# Patient Record
Sex: Female | Born: 1935
Health system: Southern US, Community
[De-identification: ages and names within clinical notes are randomized; demographics above are authoritative.]

## PROBLEM LIST (undated history)

## (undated) DIAGNOSIS — Z85828 Personal history of other malignant neoplasm of skin: Secondary | ICD-10-CM

## (undated) DIAGNOSIS — H3530 Unspecified macular degeneration: Secondary | ICD-10-CM

## (undated) DIAGNOSIS — M1990 Unspecified osteoarthritis, unspecified site: Secondary | ICD-10-CM

## (undated) DIAGNOSIS — N390 Urinary tract infection, site not specified: Secondary | ICD-10-CM

## (undated) DIAGNOSIS — F329 Major depressive disorder, single episode, unspecified: Secondary | ICD-10-CM

## (undated) DIAGNOSIS — F32A Depression, unspecified: Secondary | ICD-10-CM

## (undated) DIAGNOSIS — F419 Anxiety disorder, unspecified: Secondary | ICD-10-CM

## (undated) DIAGNOSIS — B399 Histoplasmosis, unspecified: Secondary | ICD-10-CM

## (undated) HISTORY — PX: EYE SURGERY: SHX253

## (undated) HISTORY — PX: COSMETIC SURGERY: SHX468

## (undated) HISTORY — PX: BREAST LUMPECTOMY: SHX2

## (undated) HISTORY — PX: ANAL FISSURE REPAIR: SHX2312

## (undated) HISTORY — PX: OTHER SURGICAL HISTORY: SHX169

---

## 1997-07-17 ENCOUNTER — Ambulatory Visit (HOSPITAL_COMMUNITY): Admission: RE | Admit: 1997-07-17 | Discharge: 1997-07-17 | Payer: Self-pay | Admitting: Family Medicine

## 1997-10-22 ENCOUNTER — Other Ambulatory Visit: Admission: RE | Admit: 1997-10-22 | Discharge: 1997-10-22 | Payer: Self-pay | Admitting: Internal Medicine

## 1998-12-02 ENCOUNTER — Other Ambulatory Visit: Admission: RE | Admit: 1998-12-02 | Discharge: 1998-12-02 | Payer: Self-pay | Admitting: Family Medicine

## 1998-12-23 ENCOUNTER — Encounter: Payer: Self-pay | Admitting: Family Medicine

## 1998-12-23 ENCOUNTER — Ambulatory Visit (HOSPITAL_COMMUNITY): Admission: RE | Admit: 1998-12-23 | Discharge: 1998-12-23 | Payer: Self-pay | Admitting: Family Medicine

## 2000-03-10 ENCOUNTER — Other Ambulatory Visit: Admission: RE | Admit: 2000-03-10 | Discharge: 2000-03-10 | Payer: Self-pay | Admitting: Family Medicine

## 2000-03-14 ENCOUNTER — Ambulatory Visit (HOSPITAL_COMMUNITY): Admission: RE | Admit: 2000-03-14 | Discharge: 2000-03-14 | Payer: Self-pay | Admitting: Family Medicine

## 2000-03-14 ENCOUNTER — Encounter: Payer: Self-pay | Admitting: Family Medicine

## 2001-03-27 ENCOUNTER — Encounter: Payer: Self-pay | Admitting: Family Medicine

## 2001-03-27 ENCOUNTER — Ambulatory Visit (HOSPITAL_COMMUNITY): Admission: RE | Admit: 2001-03-27 | Discharge: 2001-03-27 | Payer: Self-pay | Admitting: Family Medicine

## 2002-05-02 ENCOUNTER — Other Ambulatory Visit: Admission: RE | Admit: 2002-05-02 | Discharge: 2002-05-02 | Payer: Self-pay | Admitting: Family Medicine

## 2002-05-07 ENCOUNTER — Encounter: Payer: Self-pay | Admitting: Family Medicine

## 2002-05-07 ENCOUNTER — Ambulatory Visit (HOSPITAL_COMMUNITY): Admission: RE | Admit: 2002-05-07 | Discharge: 2002-05-07 | Payer: Self-pay | Admitting: Family Medicine

## 2003-05-09 ENCOUNTER — Ambulatory Visit (HOSPITAL_COMMUNITY): Admission: RE | Admit: 2003-05-09 | Discharge: 2003-05-09 | Payer: Self-pay | Admitting: Family Medicine

## 2003-06-27 ENCOUNTER — Encounter: Admission: RE | Admit: 2003-06-27 | Discharge: 2003-06-27 | Payer: Self-pay | Admitting: Family Medicine

## 2004-06-29 ENCOUNTER — Ambulatory Visit (HOSPITAL_COMMUNITY): Admission: RE | Admit: 2004-06-29 | Discharge: 2004-06-29 | Payer: Self-pay | Admitting: Family Medicine

## 2005-07-01 ENCOUNTER — Ambulatory Visit (HOSPITAL_COMMUNITY): Admission: RE | Admit: 2005-07-01 | Discharge: 2005-07-01 | Payer: Self-pay | Admitting: Family Medicine

## 2006-07-19 ENCOUNTER — Ambulatory Visit (HOSPITAL_COMMUNITY): Admission: RE | Admit: 2006-07-19 | Discharge: 2006-07-19 | Payer: Self-pay | Admitting: Family Medicine

## 2007-08-14 ENCOUNTER — Ambulatory Visit (HOSPITAL_COMMUNITY): Admission: RE | Admit: 2007-08-14 | Discharge: 2007-08-14 | Payer: Self-pay | Admitting: Family Medicine

## 2008-08-14 ENCOUNTER — Ambulatory Visit (HOSPITAL_COMMUNITY): Admission: RE | Admit: 2008-08-14 | Discharge: 2008-08-14 | Payer: Self-pay | Admitting: Family Medicine

## 2009-06-23 ENCOUNTER — Encounter: Admission: RE | Admit: 2009-06-23 | Discharge: 2009-07-29 | Payer: Self-pay | Admitting: Family Medicine

## 2009-09-04 ENCOUNTER — Ambulatory Visit (HOSPITAL_COMMUNITY): Admission: RE | Admit: 2009-09-04 | Discharge: 2009-09-04 | Payer: Self-pay | Admitting: Family Medicine

## 2010-06-28 ENCOUNTER — Other Ambulatory Visit (HOSPITAL_COMMUNITY): Payer: Self-pay | Admitting: Family Medicine

## 2010-06-28 DIAGNOSIS — Z1231 Encounter for screening mammogram for malignant neoplasm of breast: Secondary | ICD-10-CM

## 2010-09-07 ENCOUNTER — Ambulatory Visit (HOSPITAL_COMMUNITY)
Admission: RE | Admit: 2010-09-07 | Discharge: 2010-09-07 | Disposition: A | Discharge status: Home / Self Care | Payer: Medicare Other | Source: Ambulatory Visit | Attending: Family Medicine | Admitting: Family Medicine

## 2010-09-07 DIAGNOSIS — Z1231 Encounter for screening mammogram for malignant neoplasm of breast: Secondary | ICD-10-CM

## 2014-01-28 ENCOUNTER — Inpatient Hospital Stay (HOSPITAL_COMMUNITY)
Admission: EM | Admit: 2014-01-28 | Discharge: 2014-01-29 | DRG: 689 | Disposition: A | Discharge status: Home / Self Care | Payer: Medicare HMO | Attending: Family Medicine | Admitting: Family Medicine

## 2014-01-28 ENCOUNTER — Emergency Department (HOSPITAL_COMMUNITY): Payer: Medicare HMO

## 2014-01-28 ENCOUNTER — Encounter (HOSPITAL_COMMUNITY): Payer: Self-pay | Admitting: Physical Medicine and Rehabilitation

## 2014-01-28 DIAGNOSIS — H3530 Unspecified macular degeneration: Secondary | ICD-10-CM | POA: Diagnosis present

## 2014-01-28 DIAGNOSIS — R410 Disorientation, unspecified: Secondary | ICD-10-CM | POA: Diagnosis present

## 2014-01-28 DIAGNOSIS — K7689 Other specified diseases of liver: Secondary | ICD-10-CM

## 2014-01-28 DIAGNOSIS — G92 Toxic encephalopathy: Secondary | ICD-10-CM | POA: Diagnosis present

## 2014-01-28 DIAGNOSIS — Z880 Allergy status to penicillin: Secondary | ICD-10-CM | POA: Diagnosis not present

## 2014-01-28 DIAGNOSIS — R739 Hyperglycemia, unspecified: Secondary | ICD-10-CM | POA: Diagnosis present

## 2014-01-28 DIAGNOSIS — G459 Transient cerebral ischemic attack, unspecified: Secondary | ICD-10-CM | POA: Diagnosis present

## 2014-01-28 DIAGNOSIS — Z882 Allergy status to sulfonamides status: Secondary | ICD-10-CM

## 2014-01-28 DIAGNOSIS — R945 Abnormal results of liver function studies: Secondary | ICD-10-CM | POA: Diagnosis present

## 2014-01-28 DIAGNOSIS — E871 Hypo-osmolality and hyponatremia: Secondary | ICD-10-CM | POA: Diagnosis present

## 2014-01-28 DIAGNOSIS — N390 Urinary tract infection, site not specified: Principal | ICD-10-CM | POA: Diagnosis present

## 2014-01-28 DIAGNOSIS — R4182 Altered mental status, unspecified: Secondary | ICD-10-CM

## 2014-01-28 HISTORY — DX: Personal history of other malignant neoplasm of skin: Z85.828

## 2014-01-28 HISTORY — DX: Anxiety disorder, unspecified: F41.9

## 2014-01-28 HISTORY — DX: Unspecified macular degeneration: H35.30

## 2014-01-28 HISTORY — DX: Urinary tract infection, site not specified: N39.0

## 2014-01-28 HISTORY — DX: Unspecified osteoarthritis, unspecified site: M19.90

## 2014-01-28 HISTORY — DX: Histoplasmosis, unspecified: B39.9

## 2014-01-28 HISTORY — DX: Depression, unspecified: F32.A

## 2014-01-28 HISTORY — DX: Major depressive disorder, single episode, unspecified: F32.9

## 2014-01-28 LAB — CBC WITH DIFFERENTIAL/PLATELET
Basophils Absolute: 0 10*3/uL (ref 0.0–0.1)
Basophils Relative: 0 % (ref 0–1)
Eosinophils Absolute: 0.1 10*3/uL (ref 0.0–0.7)
Eosinophils Relative: 1 % (ref 0–5)
HCT: 38.9 % (ref 36.0–46.0)
Hemoglobin: 13.2 g/dL (ref 12.0–15.0)
Lymphocytes Relative: 14 % (ref 12–46)
Lymphs Abs: 0.9 10*3/uL (ref 0.7–4.0)
MCH: 32 pg (ref 26.0–34.0)
MCHC: 33.9 g/dL (ref 30.0–36.0)
MCV: 94.2 fL (ref 78.0–100.0)
MONO ABS: 0.6 10*3/uL (ref 0.1–1.0)
Monocytes Relative: 8 % (ref 3–12)
NEUTROS PCT: 77 % (ref 43–77)
Neutro Abs: 5.3 10*3/uL (ref 1.7–7.7)
PLATELETS: 160 10*3/uL (ref 150–400)
RBC: 4.13 MIL/uL (ref 3.87–5.11)
RDW: 12.6 % (ref 11.5–15.5)
WBC: 6.8 10*3/uL (ref 4.0–10.5)

## 2014-01-28 LAB — COMPREHENSIVE METABOLIC PANEL
ALBUMIN: 4.2 g/dL (ref 3.5–5.2)
ALT: 18 U/L (ref 0–35)
AST: 43 U/L — AB (ref 0–37)
Alkaline Phosphatase: 70 U/L (ref 39–117)
Anion gap: 14 (ref 5–15)
BILIRUBIN TOTAL: 0.4 mg/dL (ref 0.3–1.2)
BUN: 12 mg/dL (ref 6–23)
CALCIUM: 9.5 mg/dL (ref 8.4–10.5)
CHLORIDE: 91 meq/L — AB (ref 96–112)
CO2: 26 mEq/L (ref 19–32)
Creatinine, Ser: 0.64 mg/dL (ref 0.50–1.10)
GFR calc Af Amer: 90 mL/min — ABNORMAL LOW (ref >90)
GFR calc non Af Amer: 77 mL/min — ABNORMAL LOW (ref >90)
Glucose, Bld: 111 mg/dL — ABNORMAL HIGH (ref 70–99)
POTASSIUM: 3.7 meq/L (ref 3.7–5.3)
SODIUM: 131 meq/L — AB (ref 137–147)
Total Protein: 7.2 g/dL (ref 6.0–8.3)

## 2014-01-28 LAB — URINALYSIS, ROUTINE W REFLEX MICROSCOPIC
BILIRUBIN URINE: NEGATIVE
GLUCOSE, UA: NEGATIVE mg/dL
Hgb urine dipstick: NEGATIVE
KETONES UR: 15 mg/dL — AB
NITRITE: NEGATIVE
PH: 7.5 (ref 5.0–8.0)
Protein, ur: NEGATIVE mg/dL
Specific Gravity, Urine: 1.017 (ref 1.005–1.030)
Urobilinogen, UA: 0.2 mg/dL (ref 0.0–1.0)

## 2014-01-28 LAB — URINE MICROSCOPIC-ADD ON

## 2014-01-28 LAB — SODIUM, URINE, RANDOM: Sodium, Ur: 81 mEq/L

## 2014-01-28 LAB — TSH: TSH: 1.18 u[IU]/mL (ref 0.350–4.500)

## 2014-01-28 LAB — AMMONIA: Ammonia: 10 umol/L — ABNORMAL LOW (ref 11–60)

## 2014-01-28 MED ORDER — DEXTROSE 5 % IV SOLN
1.0000 g | Freq: Once | INTRAVENOUS | Status: AC
  Administered 2014-01-28: 1 g via INTRAVENOUS
  Filled 2014-01-28: qty 10

## 2014-01-28 MED ORDER — AMITRIPTYLINE HCL 25 MG PO TABS
25.0000 mg | ORAL_TABLET | Freq: Every day | ORAL | Status: DC
  Administered 2014-01-28 – 2014-01-29 (×2): 25 mg via ORAL
  Filled 2014-01-28 (×2): qty 1

## 2014-01-28 MED ORDER — SODIUM CHLORIDE 0.9 % IV SOLN
Freq: Once | INTRAVENOUS | Status: AC
  Administered 2014-01-28: 18:00:00 via INTRAVENOUS

## 2014-01-28 MED ORDER — SODIUM CHLORIDE 0.9 % IV SOLN
INTRAVENOUS | Status: DC
  Administered 2014-01-28 – 2014-01-29 (×2): via INTRAVENOUS

## 2014-01-28 MED ORDER — BUPROPION HCL ER (XL) 150 MG PO TB24
150.0000 mg | ORAL_TABLET | Freq: Every day | ORAL | Status: DC
  Filled 2014-01-28: qty 1

## 2014-01-28 MED ORDER — HYDRALAZINE HCL 20 MG/ML IJ SOLN: 5.0000 mg | Freq: Four times a day (QID) | INTRAMUSCULAR | Status: DC | PRN

## 2014-01-28 MED ORDER — ENOXAPARIN SODIUM 40 MG/0.4ML ~~LOC~~ SOLN
40.0000 mg | SUBCUTANEOUS | Status: DC
  Filled 2014-01-28 (×2): qty 0.4

## 2014-01-28 MED ORDER — LEVOFLOXACIN IN D5W 500 MG/100ML IV SOLN
500.0000 mg | INTRAVENOUS | Status: DC
Start: 2014-01-28 — End: 2014-01-29
  Administered 2014-01-28: 500 mg via INTRAVENOUS
  Filled 2014-01-28 (×2): qty 100

## 2014-01-28 MED ORDER — ALPRAZOLAM 0.5 MG PO TABS
0.5000 mg | ORAL_TABLET | Freq: Three times a day (TID) | ORAL | Status: DC
  Administered 2014-01-28: 0.5 mg via ORAL
  Filled 2014-01-28 (×2): qty 1

## 2014-01-28 NOTE — ED Notes (Signed)
Pt to CT at this time.

## 2014-01-28 NOTE — ED Provider Notes (Signed)
CSN: [id number]     Arrival date & time 01/28/14  1504 History   First MD Initiated Contact with Patient 01/28/14 1618     Chief Complaint  Patient presents with  . Stroke Symptoms     (Consider location/radiation/quality/duration/timing/severity/associated sxs/prior Treatment) HPI Patient is an 80 year old female who presents as a referral from her primary care doctor for concern for CVA. On Friday night she began having symptoms of slurred speech, increased confusion, and weakness of bilateral lower extremities. History is somewhat limited by the patient's confusion. Patient's son services historian.   Patient has no history of CVA. She lives at an assisted living facility, and son reports that they initially thought her symptoms of confusion and slurred speech would get better, but they did not improve over the course of the last few days. She was found laying on the floor of her apartment this morning. She reports that she took her sleeping medication and was unable to get into bed last night. She has also had difficulty getting into and out of her wheelchair due to leg weakness.   History reviewed. No pertinent past medical history. History reviewed. No pertinent past surgical history. No family history on file. History  Substance Use Topics  . Smoking status: Never Smoker   . Smokeless tobacco: Not on file  . Alcohol Use: No   OB History    No data available     Review of Systems  Constitutional: Positive for fever.  Respiratory: Negative for cough and shortness of breath.   Cardiovascular: Negative for chest pain and leg swelling.  Neurological: Positive for speech difficulty and weakness (Generalized). Negative for dizziness, syncope, numbness and headaches.      Allergies  Penicillins and Sulfur  Home Medications   Prior to Admission medications   Medication Sig Start Date End Date Taking? Authorizing Provider  ALPRAZolam Duanne Moron) 0.5 MG tablet Take 0.5 mg by  mouth 3 (three) times daily.  01/10/14  Yes Historical Provider, MD  amitriptyline (ELAVIL) 25 MG tablet Take 25 mg by mouth daily. 11/06/13  Yes Historical Provider, MD  buPROPion (WELLBUTRIN XL) 150 MG 24 hr tablet Take 150 mg by mouth daily. 01/22/14  Yes Historical Provider, MD  traZODone (DESYREL) 50 MG tablet Take 100 mg by mouth at bedtime.  12/23/13  Yes Historical Provider, MD   BP 180/69 mmHg  Pulse 81  Temp(Src) 97.8 F (36.6 C)  Resp 19  SpO2 100% Physical Exam  Constitutional: She is oriented to person, place, and time. She appears well-developed. No distress.  HENT:  Head: Normocephalic and atraumatic.  Eyes: EOM are normal. Pupils are equal, round, and reactive to light.  Neck: Normal range of motion. Neck supple. No JVD present.  No spinous process tenderness  Cardiovascular: Normal rate, regular rhythm and normal heart sounds.   No murmur heard. Pulmonary/Chest: Effort normal and breath sounds normal.  Abdominal: Soft. Bowel sounds are normal. She exhibits no distension. There is no tenderness.  Musculoskeletal: Normal range of motion. She exhibits no edema.  Neurological: She is alert and oriented to person, place, and time.  Mildly confused, rambles Cranial nerves II through X intact Strength 4 out of 5 in bilateral upper extremities Sensation grossly intact in bilateral upper and lower extremities Strength 3 out of 5 in bilateral lower extremities, symmetric Normal reflexes Speech slightly slurred   Skin: Skin is warm and dry.  Nursing note and vitals reviewed.   ED Course  Procedures (including critical care time) Labs  Review Labs Reviewed  COMPREHENSIVE METABOLIC PANEL - Abnormal; Notable for the following:    Sodium 131 (*)    Chloride 91 (*)    Glucose, Bld 111 (*)    AST 43 (*)    GFR calc non Af Amer 77 (*)    GFR calc Af Amer 90 (*)    All other components within normal limits  URINALYSIS, ROUTINE W REFLEX MICROSCOPIC - Abnormal; Notable for the  following:    APPearance CLOUDY (*)    Ketones, ur 15 (*)    Leukocytes, UA LARGE (*)    All other components within normal limits  URINE MICROSCOPIC-ADD ON - Abnormal; Notable for the following:    Squamous Epithelial / LPF FEW (*)    Bacteria, UA FEW (*)    Casts HYALINE CASTS (*)    All other components within normal limits  CBC WITH DIFFERENTIAL    Imaging Review Ct Head Wo Contrast  01/28/2014   CLINICAL DATA:  Found on the floor this morning by staff. Family states she hasn't been herself since Friday, slurred speech and difficulty walking. Pt denies pain upon arrival, she is alert, but confused  EXAM: CT HEAD WITHOUT CONTRAST  TECHNIQUE: Contiguous axial images were obtained from the base of the skull through the vertex without intravenous contrast.  COMPARISON:  06/27/2003  FINDINGS: There is no evidence of mass effect, midline shift, or extra-axial fluid collections. There is no evidence of a space-occupying lesion or intracranial hemorrhage. There is no evidence of a cortical-based area of acute infarction. There is generalized cerebral atrophy. There is periventricular white matter low attenuation likely secondary to microangiopathy.  The ventricles and sulci are appropriate for the patient's age. The basal cisterns are patent.  The right globe is intact. The left globe is irregular in contour and smaller within the right lobe with peripheral calcification. There are small air-fluid levels in bilateral maxillary sinuses.  The osseous structures are unremarkable.  IMPRESSION: No acute intracranial pathology.   Electronically Signed   By: Kathreen Devoid   On: 01/28/2014 17:11     EKG Interpretation None      MDM   Final diagnoses:  UTI (lower urinary tract infection)   80 year old female presenting with confusion and slurred speech. Nonfocal neurologic neurologic exam, symmetric upper and lower extremity strength and coordination, mildly decreased strength in the lower extremities.  Speech is mildly slurred, but this did not have an abrupt onset. Patient is afebrile, no leukocytosis noted. She does have a UA consistent with a urinary tract infection. CT of the head was obtained as the patient was found on the floor, no significant findings found, no evidence of bleeding. She has no tenderness to palpation to her chest back or extremities, no other signs of trauma.  We will treat with Rocephin and admit for observation.  Leata Mouse, MD 01/29/14 [id number]  Mariea Clonts, MD 01/29/14 [phone]

## 2014-01-28 NOTE — ED Notes (Signed)
Pt up to bedside report with 2 assist.

## 2014-01-28 NOTE — ED Notes (Signed)
Dr. Maudie Mercury at bedside for assessment for admission

## 2014-01-28 NOTE — ED Notes (Signed)
Pt from PCP office, referred to ED to r/o TIA on Friday evening. Pt lives at [facility], was found on the floor this morning by staff. Family states she hasn't been herself since Friday, slurred speech and difficulty walking. Pt denies pain upon arrival, she is alert, but confused.

## 2014-01-28 NOTE — Plan of Care (Signed)
Problem: Phase I Progression Outcomes Goal: Pain controlled with appropriate interventions Outcome: Completed/Met Date Met:  01/28/14     

## 2014-01-28 NOTE — H&P (Addendum)
Donna Webster is an 80 y.o. female.    Pcp: Lovena [facility])  Chief Complaint: confusion HPI: 80 yo female who lives at [name] apparently was somewhat confused today.  Pt c/o slight dysuria,  Pt brought to ED for evaluation and CT brain negative, and found to have uti, and mild hyponatremia.  Pt denies fever, chills, n/v, flank pain, hematuria.  There was ? tia but her AMS seems more likely secondary to uti.  MRI brain pending and ? Slight dizziness so carotid u/s pending.  Pt will be admitted observation for AMS likely secondary to uti   Past Medical History  Diagnosis Date  . Histoplasmosis   . Macular degeneration     History reviewed. No pertinent past surgical history.  Family History  Problem Relation Age of Onset  . Parkinsonism Father    Social History:  reports that she has never smoked. She does not have any smokeless tobacco history on file. She reports that she does not drink alcohol or use illicit drugs.  Allergies:  Allergies  Allergen Reactions  . Penicillins Other (See Comments)    Severe constipation / stomach cramps  . Sulfur Other (See Comments)    Severe stomach pain     (Not in a hospital admission)  Results for orders placed or performed during the hospital encounter of 01/28/14 (from the past 48 hour(s))  CBC with Differential     Status: None   Collection Time: 01/28/14  3:31 PM  Result Value Ref Range   WBC 6.8 4.0 - 10.5 K/uL   RBC 4.13 3.87 - 5.11 MIL/uL   Hemoglobin 13.2 12.0 - 15.0 g/dL   HCT 38.9 36.0 - 46.0 %   MCV 94.2 78.0 - 100.0 fL   MCH 32.0 26.0 - 34.0 pg   MCHC 33.9 30.0 - 36.0 g/dL   RDW 12.6 11.5 - 15.5 %   Platelets 160 150 - 400 K/uL   Neutrophils Relative % 77 43 - 77 %   Neutro Abs 5.3 1.7 - 7.7 K/uL   Lymphocytes Relative 14 12 - 46 %   Lymphs Abs 0.9 0.7 - 4.0 K/uL   Monocytes Relative 8 3 - 12 %   Monocytes Absolute 0.6 0.1 - 1.0 K/uL   Eosinophils Relative 1 0 - 5 %   Eosinophils Absolute  0.1 0.0 - 0.7 K/uL   Basophils Relative 0 0 - 1 %   Basophils Absolute 0.0 0.0 - 0.1 K/uL  Comprehensive metabolic panel     Status: Abnormal   Collection Time: 01/28/14  3:31 PM  Result Value Ref Range   Sodium 131 (L) 137 - 147 mEq/L   Potassium 3.7 3.7 - 5.3 mEq/L   Chloride 91 (L) 96 - 112 mEq/L   CO2 26 19 - 32 mEq/L   Glucose, Bld 111 (H) 70 - 99 mg/dL   BUN 12 6 - 23 mg/dL   Creatinine, Ser 0.64 0.50 - 1.10 mg/dL   Calcium 9.5 8.4 - 10.5 mg/dL   Total Protein 7.2 6.0 - 8.3 g/dL   Albumin 4.2 3.5 - 5.2 g/dL   AST 43 (H) 0 - 37 U/L   ALT 18 0 - 35 U/L   Alkaline Phosphatase 70 39 - 117 U/L   Total Bilirubin 0.4 0.3 - 1.2 mg/dL   GFR calc non Af Amer 77 (L) >90 mL/min   GFR calc Af Amer 90 (L) >90 mL/min    Comment: (NOTE) The eGFR has been  calculated using the CKD EPI equation. This calculation has not been validated in all clinical situations. eGFR's persistently <90 mL/min signify possible Chronic Kidney Disease.    Anion gap 14 5 - 15  Urinalysis, Routine w reflex microscopic     Status: Abnormal   Collection Time: 01/28/14  4:48 PM  Result Value Ref Range   Color, Urine YELLOW YELLOW   APPearance CLOUDY (A) CLEAR   Specific Gravity, Urine 1.017 1.005 - 1.030   pH 7.5 5.0 - 8.0   Glucose, UA NEGATIVE NEGATIVE mg/dL   Hgb urine dipstick NEGATIVE NEGATIVE   Bilirubin Urine NEGATIVE NEGATIVE   Ketones, ur 15 (A) NEGATIVE mg/dL   Protein, ur NEGATIVE NEGATIVE mg/dL   Urobilinogen, UA 0.2 0.0 - 1.0 mg/dL   Nitrite NEGATIVE NEGATIVE   Leukocytes, UA LARGE (A) NEGATIVE  Urine microscopic-add on     Status: Abnormal   Collection Time: 01/28/14  4:48 PM  Result Value Ref Range   Squamous Epithelial / LPF FEW (A) RARE   WBC, UA 21-50 <3 WBC/hpf   Bacteria, UA FEW (A) RARE   Casts HYALINE CASTS (A) NEGATIVE   Urine-Other MUCOUS PRESENT    Ct Head Wo Contrast  01/28/2014   CLINICAL DATA:  Found on the floor this morning by staff. Family states she hasn't been herself  since Friday, slurred speech and difficulty walking. Pt denies pain upon arrival, she is alert, but confused  EXAM: CT HEAD WITHOUT CONTRAST  TECHNIQUE: Contiguous axial images were obtained from the base of the skull through the vertex without intravenous contrast.  COMPARISON:  06/27/2003  FINDINGS: There is no evidence of mass effect, midline shift, or extra-axial fluid collections. There is no evidence of a space-occupying lesion or intracranial hemorrhage. There is no evidence of a cortical-based area of acute infarction. There is generalized cerebral atrophy. There is periventricular white matter low attenuation likely secondary to microangiopathy.  The ventricles and sulci are appropriate for the patient's age. The basal cisterns are patent.  The right globe is intact. The left globe is irregular in contour and smaller within the right lobe with peripheral calcification. There are small air-fluid levels in bilateral maxillary sinuses.  The osseous structures are unremarkable.  IMPRESSION: No acute intracranial pathology.   Electronically Signed   By: Kathreen Devoid   On: 01/28/2014 17:11    Review of Systems  Constitutional: Negative for fever, chills, weight loss, malaise/fatigue and diaphoresis.  HENT: Negative for congestion, ear discharge, ear pain, hearing loss, nosebleeds, sore throat and tinnitus.   Eyes: Negative for blurred vision, double vision, photophobia, pain, discharge and redness.  Respiratory: Negative for cough, hemoptysis, sputum production, shortness of breath, wheezing and stridor.   Cardiovascular: Negative for chest pain, palpitations, orthopnea, claudication, leg swelling and PND.  Gastrointestinal: Negative for heartburn, nausea, vomiting, abdominal pain, diarrhea, constipation, blood in stool and melena.  Genitourinary: Positive for dysuria. Negative for urgency, frequency, hematuria and flank pain.  Musculoskeletal: Negative for myalgias, back pain, joint pain, falls and  neck pain.  Skin: Negative for itching and rash.  Neurological: Positive for dizziness. Negative for tingling, tremors, sensory change, speech change, focal weakness, seizures, loss of consciousness, weakness and headaches.       ? slight  Endo/Heme/Allergies: Negative for environmental allergies and polydipsia. Does not bruise/bleed easily.  Psychiatric/Behavioral: Negative for depression, suicidal ideas, hallucinations and substance abuse. The patient is not nervous/anxious and does not have insomnia.     Blood pressure 156/84, pulse 85,  temperature 97.8 F (36.6 C), resp. rate 18, SpO2 95 %. Physical Exam  Constitutional: She is oriented to person, place, and time. She appears well-developed and well-nourished.  HENT:  Head: Normocephalic and atraumatic.  Eyes: Conjunctivae and EOM are normal. Pupils are equal, round, and reactive to light.  Neck: Normal range of motion. Neck supple. No JVD present. No tracheal deviation present. No thyromegaly present.  Cardiovascular: Normal rate and regular rhythm.  Exam reveals no gallop and no friction rub.   No murmur heard. Respiratory: Effort normal and breath sounds normal. No stridor. No respiratory distress. She has no wheezes. She has no rales.  GI: Soft. Bowel sounds are normal. She exhibits no distension and no mass. There is no tenderness. There is no rebound and no guarding.  Musculoskeletal: Normal range of motion. She exhibits no edema or tenderness.  Neurological: She is alert and oriented to person, place, and time. She has normal reflexes. She displays normal reflexes. No cranial nerve deficit. She exhibits normal muscle tone. Coordination normal.  Skin: Skin is warm and dry. No rash noted. No erythema. No pallor.  Psychiatric: She has a normal mood and affect. Her behavior is normal.     Assessment/Plan AMS likely secondary to uti tx with levaquin 568m iv qday  Dizziness Check ammonia Check MRI brain, carotid u/s  Abnormal  lft: Check acute hepatitis panel Check CT scan  Hyponatremia Check cortisol, tsh , serum osm, urine osm, urine sodium  Hyperglycemia Check hga1c        KJani Gravel12/09/2013, 7:16 PM

## 2014-01-28 NOTE — Plan of Care (Signed)
Problem: Phase I Progression Outcomes Goal: Voiding-avoid urinary catheter unless indicated Outcome: Completed/Met Date Met:  01/28/14     

## 2014-01-28 NOTE — ED Notes (Signed)
Pt st's she took a sleeping pill last pm and waited too long before going to bed.  St's she was unable to get her legs up on bed due to sleeping pill so she just slept on the floor all night.  St's she could not get up this am due to being stiff from sleeping on floor.  Pt's son came out into hall to explain to me that pt has not been herself since Fri night.  St's pt is more argumentative with slightly slurred speech and also has noticed that pt can't ambulate as well as she could last week.  Son also st's that pt keeps her medications in a pill box and he has also noticed that pills were mixed up last week.

## 2014-01-29 ENCOUNTER — Encounter (HOSPITAL_COMMUNITY): Payer: Self-pay | Admitting: General Practice

## 2014-01-29 ENCOUNTER — Observation Stay (HOSPITAL_COMMUNITY): Payer: Medicare HMO

## 2014-01-29 DIAGNOSIS — N390 Urinary tract infection, site not specified: Secondary | ICD-10-CM

## 2014-01-29 HISTORY — DX: Urinary tract infection, site not specified: N39.0

## 2014-01-29 LAB — COMPREHENSIVE METABOLIC PANEL
ALT: 16 U/L (ref 0–35)
ANION GAP: 13 (ref 5–15)
AST: 38 U/L — ABNORMAL HIGH (ref 0–37)
Albumin: 3.5 g/dL (ref 3.5–5.2)
Alkaline Phosphatase: 57 U/L (ref 39–117)
BILIRUBIN TOTAL: 0.5 mg/dL (ref 0.3–1.2)
BUN: 9 mg/dL (ref 6–23)
CHLORIDE: 99 meq/L (ref 96–112)
CO2: 24 mEq/L (ref 19–32)
Calcium: 9.3 mg/dL (ref 8.4–10.5)
Creatinine, Ser: 0.66 mg/dL (ref 0.50–1.10)
GFR calc Af Amer: 89 mL/min — ABNORMAL LOW (ref >90)
GFR calc non Af Amer: 77 mL/min — ABNORMAL LOW (ref >90)
GLUCOSE: 79 mg/dL (ref 70–99)
POTASSIUM: 4.1 meq/L (ref 3.7–5.3)
Sodium: 136 mEq/L — ABNORMAL LOW (ref 137–147)
Total Protein: 6.3 g/dL (ref 6.0–8.3)

## 2014-01-29 LAB — HEPATITIS PANEL, ACUTE
HCV Ab: NEGATIVE
Hep A IgM: NONREACTIVE
Hep B C IgM: NONREACTIVE
Hepatitis B Surface Ag: NEGATIVE

## 2014-01-29 LAB — CORTISOL: Cortisol, Plasma: 15.7 ug/dL

## 2014-01-29 LAB — OSMOLALITY, URINE: OSMOLALITY UR: 250 mosm/kg — AB (ref 390–1090)

## 2014-01-29 LAB — OSMOLALITY: OSMOLALITY: 274 mosm/kg — AB (ref 275–300)

## 2014-01-29 MED ORDER — IOHEXOL 300 MG/ML  SOLN
100.0000 mL | Freq: Once | INTRAMUSCULAR | Status: AC | PRN
  Administered 2014-01-29: 100 mL via INTRAVENOUS

## 2014-01-29 MED ORDER — IOHEXOL 300 MG/ML  SOLN
20.0000 mL | INTRAMUSCULAR | Status: AC
  Administered 2014-01-29 (×2): 20 mL via ORAL

## 2014-01-29 NOTE — Evaluation (Signed)
Physical Therapy Evaluation Patient Details Name: Donna Webster MRN: [id number] DOB: 06-Aug-1923 Today's Date: 01/29/2014   History of Present Illness  80 yo female who lives at [name] apparently was somewhat confused today.  Pt c/o slight dysuria,  Pt brought to ED for evaluation and CT brain negative, and found to have uti, hyperglycemia, and mild hyponatremia. Pt with some dizziness, carotids to be checked.  Clinical Impression  Pt admitted with AMS and decreased mobility due to generalized weakness. Pt currently with functional limitations due to the deficits listed below (see PT Problem List). Pt needed min A for safe ambulation today due to unsteadiness and fatigue.  Pt will benefit from skilled PT to increase their independence and safety with mobility to allow discharge to the venue listed below. PT will continue to follow.       Follow Up Recommendations Home health PT;Supervision - Intermittent    Equipment Recommendations  None recommended by PT    Recommendations for Other Services OT consult     Precautions / Restrictions Precautions Precautions: Fall Restrictions Weight Bearing Restrictions: No      Mobility  Bed Mobility Overal bed mobility: Needs Assistance Bed Mobility: Supine to Sit     Supine to sit: Min assist     General bed mobility comments: min A to elevate trunk from mattress, pt reports she is always able to get out of the bed without help but is fatigued today  Transfers Overall transfer level: Needs assistance Equipment used: 4-wheeled walker Transfers: Sit to/from Stand Sit to Stand: Min assist         General transfer comment: min A to steady  Ambulation/Gait Ambulation/Gait assistance: Min assist Ambulation Distance (Feet): 150 Feet Assistive device: 4-wheeled walker Gait Pattern/deviations: Trunk flexed;Decreased dorsiflexion - right;Decreased stance time - right;Step-through pattern;Decreased stride length Gait velocity:  decreased   General Gait Details: pt needs cueing for objects on right side due to visual impairment, decreased proprioception as well. Occasional staggering to right and left and difficulty with turning. Trunk flexed and RW too far in front though pt has difficulty correcting  Stairs            Wheelchair Mobility    Modified Rankin (Stroke Patients Only)       Balance Overall balance assessment: Needs assistance Sitting-balance support: No upper extremity supported Sitting balance-Leahy Scale: Fair     Standing balance support: Single extremity supported;During functional activity Standing balance-Leahy Scale: Poor Standing balance comment: needs at least unilateral support to maintain standing safely                             Pertinent Vitals/Pain Pain Assessment: No/denies pain    Home Living Family/patient expects to be discharged to:: Other (Comment) [facility]) Living Arrangements: Alone Available Help at Discharge: Family Type of Home: Apartment Home Access: Level entry     Home Layout: One level Home Equipment: Environmental consultant - 4 wheels Additional Comments: pt wears sunglasses when out in hallway for eye protection, almost blind    Prior Function Level of Independence: Independent with assistive device(s)         Comments: takes care of herself within her apt, walks with her RW to dining room for meals, does not have to clean her apt     Hand Dominance        Extremity/Trunk Assessment   Upper Extremity Assessment: Defer to OT evaluation  Lower Extremity Assessment: Generalized weakness;RLE deficits/detail RLE Deficits / Details: RLE weaker than left from previous injury (1992), occasionally dragging right foot during ambulation, tests 3-/5 quad    Cervical / Trunk Assessment: Kyphotic  Communication   Communication: No difficulties  Cognition Arousal/Alertness: Awake/alert Behavior During Therapy: WFL for tasks  assessed/performed Overall Cognitive Status: Impaired/Different from baseline Area of Impairment: Memory;Safety/judgement     Memory: Decreased short-term memory   Safety/Judgement: Decreased awareness of safety     General Comments: having a hard time sequencing events and remembering details, she is aware of this and frustrated by it, repeatedly says her head is not right presently. Seems less aware of unsteadiness in standing though she does mention that she is weaker than her normal    General Comments      Exercises        Assessment/Plan    PT Assessment Patient needs continued PT services  PT Diagnosis Abnormality of gait;Generalized weakness   PT Problem List Decreased strength;Decreased activity tolerance;Decreased balance;Decreased mobility;Decreased cognition;Decreased coordination;Decreased safety awareness;Decreased knowledge of precautions  PT Treatment Interventions DME instruction;Gait training;Functional mobility training;Therapeutic activities;Therapeutic exercise;Balance training;Cognitive remediation;Patient/family education   PT Goals (Current goals can be found in the Care Plan section) Acute Rehab PT Goals Patient Stated Goal: return to [place] PT Goal Formulation: With patient Time For Goal Achievement: 02/12/14 Potential to Achieve Goals: Good    Frequency Min 3X/week   Barriers to discharge Decreased caregiver support son checks on her    Co-evaluation               End of Session Equipment Utilized During Treatment: Gait belt Activity Tolerance: Patient tolerated treatment well Patient left: in bed;with call bell/phone within reach;with bed alarm set Nurse Communication: Mobility status         Time: 0920-0952 PT Time Calculation (min) (ACUTE ONLY): 32 min   Charges:   PT Evaluation $Initial PT Evaluation Tier I: 1 Procedure PT Treatments $Gait Training: 23-37 mins   PT G Codes:        Leighton Roach, PT  Acute  Rehab Services  [place], [place] 01/29/2014, 11:52 AM

## 2014-01-29 NOTE — Progress Notes (Signed)
Paged Dr. Verlon Au, pt back from CT, may she have diet order.

## 2014-01-29 NOTE — Progress Notes (Signed)
Pt discharge instructions given, pts son verbalized understanding.  VSS. Denies pain. Pt left floor via wheelchair accompanied by staff and family.

## 2014-01-29 NOTE — Clinical Social Work Note (Signed)
Patient is medically stable for discharge back to [facility]. CSW contacted facility and confirmed with staff person Yvone Neu that patient is from [place]. CSW also spoke with Chong Sicilian, Freight forwarder at facility regarding patient returning. Today.  Antione Obar Givens, MSW, LCSW [phone]

## 2014-01-29 NOTE — Progress Notes (Signed)
UR Completed.  Vergie Living [phone] 01/29/2014

## 2014-01-29 NOTE — Discharge Summary (Signed)
Physician Discharge Summary  DASHLEY MONTS YKD:[id number] DOB: [birth year] DOA: 01/28/2014  PCP: Marjorie Smolder, MD  Admit date: 01/28/2014 Discharge date: 01/29/2014  Time spent: 35 minutes  Recommendations for Outpatient Follow-up:  1. Please get LFT's in about 1 week 2. Please consider non-emergent Ct chest to rule out Ascedning aorta dilation-NOTE-patient not having CP at time of d/c   Discharge Diagnoses:  Active Problems:   UTI (lower urinary tract infection)   Altered mental status   Hyponatremia   Abnormal liver function   Discharge Condition: Good  Diet recommendation: Heart healthy  Filed Weights   01/28/14 2029  Weight: 53.842 kg (118 lb 11.2 oz)    History of present illness:  80 y/o ? known priro h/o Histoplasmosis/Mac degeneration admitted 01/28/14 with toxic metabolic encephalopathy and a concern for TIA. Because of deranged LFT's on admission AST 43/ALT 18, CT scan done showed GB slugdge and Dilated Asce Aorta She was sent over from her regular physician's office because of concerns dizziness but is completely oriented alert and knows where she is and states that she has no pain fever chills nausea vomiting falls weakness. She's very anxious to go home and I had a discussion with her and her son about follow-up as an outpatient. I feel it is reasonable to send her home however she should be offered a CT of the chest as an outpatient to rule out ascending aortic aneurysm as this is poorly visualized on CT abdomen pelvis. Her weight loss which her son is concerned about could be from mild cholelithiasis that is nonobstructive and she is in no imminent need for surgery    Discharge Exam: Filed Vitals:   01/29/14 0440  BP: 144/64  Pulse: 86  Temp: 97.6 F (36.4 C)  Resp: 18    General: Alert pleasant oriented Cardiovascular: S1-S2 no murmur rub or gallop Respiratory: Clinically clear  Discharge Instructions You were cared for by a hospitalist during your  hospital stay. If you have any questions about your discharge medications or the care you received while you were in the hospital after you are discharged, you can call the unit and asked to speak with the hospitalist on call if the hospitalist that took care of you is not available. Once you are discharged, your primary care physician will handle any further medical issues. Please note that NO REFILLS for any discharge medications will be authorized once you are discharged, as it is imperative that you return to your primary care physician (or establish a relationship with a primary care physician if you do not have one) for your aftercare needs so that they can reassess your need for medications and monitor your lab values.  Discharge Instructions    Diet - low sodium heart healthy    Complete by:  As directed      Discharge instructions    Complete by:  As directed   Please follow with Dr. Inda Merlin for further recommendations.     Increase activity slowly    Complete by:  As directed           Current Discharge Medication List    CONTINUE these medications which have NOT CHANGED   Details  ALPRAZolam (XANAX) 0.5 MG tablet Take 0.5 mg by mouth 3 (three) times daily.     amitriptyline (ELAVIL) 25 MG tablet Take 25 mg by mouth daily.    buPROPion (WELLBUTRIN XL) 150 MG 24 hr tablet Take 150 mg by mouth daily.    traZODone (  DESYREL) 50 MG tablet Take 100 mg by mouth at bedtime.        Allergies  Allergen Reactions  . Penicillins Other (See Comments)    Severe constipation / stomach cramps  . Sulfur Other (See Comments)    Severe stomach pain      The results of significant diagnostics from this hospitalization (including imaging, microbiology, ancillary and laboratory) are listed below for reference.    Significant Diagnostic Studies: Ct Head Wo Contrast  01/28/2014   CLINICAL DATA:  Found on the floor this morning by staff. Family states she hasn't been herself since Friday,  slurred speech and difficulty walking. Pt denies pain upon arrival, she is alert, but confused  EXAM: CT HEAD WITHOUT CONTRAST  TECHNIQUE: Contiguous axial images were obtained from the base of the skull through the vertex without intravenous contrast.  COMPARISON:  06/27/2003  FINDINGS: There is no evidence of mass effect, midline shift, or extra-axial fluid collections. There is no evidence of a space-occupying lesion or intracranial hemorrhage. There is no evidence of a cortical-based area of acute infarction. There is generalized cerebral atrophy. There is periventricular white matter low attenuation likely secondary to microangiopathy.  The ventricles and sulci are appropriate for the patient's age. The basal cisterns are patent.  The right globe is intact. The left globe is irregular in contour and smaller within the right lobe with peripheral calcification. There are small air-fluid levels in bilateral maxillary sinuses.  The osseous structures are unremarkable.  IMPRESSION: No acute intracranial pathology.   Electronically Signed   By: Kathreen Devoid   On: 01/28/2014 17:11   Ct Abdomen Pelvis W Contrast  01/29/2014   EXAM: CT ABDOMEN AND PELVIS WITH CONTRAST  TECHNIQUE: Multidetector CT imaging of the abdomen and pelvis was performed using the standard protocol following bolus administration of intravenous contrast.  CONTRAST:  11mL OMNIPAQUE IOHEXOL 300 MG/ML IV. Oral contrast was also administered.  COMPARISON:  None.  FINDINGS: Mild intra and extrahepatic biliary ductal dilation, the common duct measuring up to approximately 8 mm. The duct can be followed to the ampulla where there is intraluminal material. No calcified gallstones within the gallbladder, though there may be gallbladder sludge. No evidence for acute cholecystitis.  Numerous calcified granulomata throughout the liver. Multiple hepatic cysts, the largest approximating 2 cm in the caudate lobe; no significant focal hepatic parenchymal  abnormalities. Multiple calcified granulomata throughout the otherwise normal-appearing spleen. Pancreas mildly atrophic, accounting for the borderline pancreatic ductal dilation. Normal adrenal glands and kidneys. Severe aortoiliofemoral atherosclerosis without aneurysm. Stenosis at the origin of the IMA. Atherosclerotic though patent origins of the SMA, celiac and both renal arteries. No significant lymphadenopathy.  Stomach completely decompressed and unremarkable. Normal-appearing small bowel. Tortuous and redundant colon with severe diverticulosis involving the descending and sigmoid colon without evidence of acute diverticulitis. Appendix not clearly visualized, but no pericecal inflammation. No ascites.  Urinary bladder unremarkable. Uterus atrophic consistent with age containing submucosal calcifications which are likely dystrophic. Endometrial thickening, more so than expected for age. No adnexal masses. Numerous pelvic phleboliths. No free pelvic fluid.  Bone window images demonstrate lumbar scoliosis convex right, severe diffuse degenerative disc disease, spondylosis and facet degenerative changes throughout the lower thoracic and lumbar spine, degenerative grade 1 spondylolisthesis of L5 on S1 approximating 9 mm, osseous demineralization, degenerative changes in the sacroiliac joints, and moderate degenerative changes in both hips. Scarring involving the visualized lower lobes, with calcified pleural plaque along the right hemidiaphragm. Heart enlarged with aortic  annular, mitral annular and aortic valvular calcification. Visualized ascending thoracic aorta dilated up to approximately 4.5 cm diameter.  IMPRESSION: 1. Mild intra and extrahepatic biliary ductal dilation with material in the distal common bile duct that is likely sludge, though a small ampullary tumor can have a similar appearance on CT. ERCP or MRCP may be helpful in further evaluation. 2. Atrophic uterus with endometrial thickening that  is more prominent than expected for patient of this age. Is there dysfunctional uterine bleeding? If further imaging evaluation is felt necessary, pelvic ultrasound may be helpful. 3. Extensive diverticulosis involving the descending and sigmoid colon without evidence of acute diverticulitis. 4. Gallbladder sludge without evidence of cholelithiasis or cholecystitis. 5. Multiple hepatic cysts. Calcified granulomata involving the liver and spleen. 6. Pancreatic atrophy. 7. Dilation of the visualized ascending thoracic aorta up to approximately 4.5 cm diameter. This may be secondary to aortic stenosis, as there are is moderate aortic valvular calcification. Nonemergent CTA of the chest may be helpful for complete evaluation of the thoracic aorta. 8. Osseous findings as above.   Electronically Signed   By: Evangeline Dakin M.D.   On: 01/29/2014 13:12    Microbiology: No results found for this or any previous visit (from the past 240 hour(s)).   Labs: Basic Metabolic Panel:  Recent Labs Lab 01/28/14 1531 01/29/14 0535  NA 131* 136*  K 3.7 4.1  CL 91* 99  CO2 26 24  GLUCOSE 111* 79  BUN 12 9  CREATININE 0.64 0.66  CALCIUM 9.5 9.3   Liver Function Tests:  Recent Labs Lab 01/28/14 1531 01/29/14 0535  AST 43* 38*  ALT 18 16  ALKPHOS 70 57  BILITOT 0.4 0.5  PROT 7.2 6.3  ALBUMIN 4.2 3.5   No results for input(s): LIPASE, AMYLASE in the last 168 hours.  Recent Labs Lab 01/28/14 2035  AMMONIA 10*   CBC:  Recent Labs Lab 01/28/14 1531  WBC 6.8  NEUTROABS 5.3  HGB 13.2  HCT 38.9  MCV 94.2  PLT 160   Cardiac Enzymes: No results for input(s): CKTOTAL, CKMB, CKMBINDEX, TROPONINI in the last 168 hours. BNP: BNP (last 3 results) No results for input(s): PROBNP in the last 8760 hours. CBG: No results for input(s): GLUCAP in the last 168 hours.     SignedNita Sells  [facility] 01/29/2014, 2:00 PM

## 2014-01-31 NOTE — Progress Notes (Signed)
CARE MANAGEMENT NOTE 01/31/2014  Patient:  Donna Webster, Donna Webster   Account Number:  [id number]  Date Initiated:  01/29/2014  Documentation initiated by:  ROYAL,CHERYL  Subjective/Objective Assessment:   CM following for progression and d/c planning     Action/Plan:   Met with pt and son and they wish for PT to continue and be increased.  01/30/2014 [place] preferred by [facility], however they do not network with pt insurance. [facility] will provide services.   Anticipated DC Date:  01/29/2014   Anticipated DC Plan:  [place]  CM consult      Choice offered to / List presented to:          [facility] arranged  HH-2 PT      Status of service:  Completed, signed off Medicare Important Message given?  NA - LOS <3 / Initial given by admissions (If response is "NO", the following Medicare IM given date fields will be blank) Date Medicare IM given:   Medicare IM given by:   Date Additional Medicare IM given:   Additional Medicare IM given by:    Discharge Disposition:  [place]  Per UR Regulation:    If discussed at Long Length of Stay Meetings, dates discussed:    Comments:  01/31/2014 0945 NCM contacted son, Donna Webster # [phone] to get info on [facility] agency. Unable to provide info. States he will give NCM a call back. Jonnie Finner RN CCM Case Mgmt phone [phone]  01/30/2014 All info and HHPT orders sent to [facility] as preferred provider for [facility] , however this CM notified today they they are out of network with pt insurance. [place] is unable to provide name of current PT provider. This CM spoke with son who also is unable to provide name of current provider and pt is sl confused. Therefore order was given to [facility] as they are in network with pt insurance and will search for other possible provider. This was discussed with pt son Mr Donna Webster, who is in agreement.  CRoyal RN MPH,  case manager, [phone]  01/29/2014 Pt is resident of [name] where she is in [place] living. Pt reports that she is receiving HHPT once a week currently.  New orders will be faxed to current povider. CRoyal RN MPH, case manager, [phone]

## 2014-02-10 ENCOUNTER — Inpatient Hospital Stay (HOSPITAL_COMMUNITY): Payer: Commercial Managed Care - HMO

## 2014-02-10 ENCOUNTER — Encounter (HOSPITAL_COMMUNITY): Payer: Self-pay | Admitting: Family Medicine

## 2014-02-10 ENCOUNTER — Inpatient Hospital Stay (HOSPITAL_COMMUNITY)
Admission: EM | Admit: 2014-02-10 | Discharge: 2014-02-13 | DRG: 070 | Disposition: A | Discharge status: Skilled Nursing Facility | Payer: Commercial Managed Care - HMO | Attending: Internal Medicine | Admitting: Internal Medicine

## 2014-02-10 DIAGNOSIS — E43 Unspecified severe protein-calorie malnutrition: Secondary | ICD-10-CM | POA: Insufficient documentation

## 2014-02-10 DIAGNOSIS — Z85828 Personal history of other malignant neoplasm of skin: Secondary | ICD-10-CM | POA: Diagnosis not present

## 2014-02-10 DIAGNOSIS — Z681 Body mass index (BMI) 19 or less, adult: Secondary | ICD-10-CM | POA: Diagnosis not present

## 2014-02-10 DIAGNOSIS — F419 Anxiety disorder, unspecified: Secondary | ICD-10-CM | POA: Diagnosis present

## 2014-02-10 DIAGNOSIS — Z87891 Personal history of nicotine dependence: Secondary | ICD-10-CM | POA: Diagnosis not present

## 2014-02-10 DIAGNOSIS — B399 Histoplasmosis, unspecified: Secondary | ICD-10-CM | POA: Diagnosis present

## 2014-02-10 DIAGNOSIS — E860 Dehydration: Secondary | ICD-10-CM | POA: Diagnosis present

## 2014-02-10 DIAGNOSIS — L299 Pruritus, unspecified: Secondary | ICD-10-CM | POA: Diagnosis present

## 2014-02-10 DIAGNOSIS — R410 Disorientation, unspecified: Secondary | ICD-10-CM | POA: Diagnosis present

## 2014-02-10 DIAGNOSIS — B999 Unspecified infectious disease: Secondary | ICD-10-CM

## 2014-02-10 DIAGNOSIS — H3530 Unspecified macular degeneration: Secondary | ICD-10-CM | POA: Diagnosis present

## 2014-02-10 DIAGNOSIS — R4182 Altered mental status, unspecified: Secondary | ICD-10-CM

## 2014-02-10 DIAGNOSIS — D721 Eosinophilia: Secondary | ICD-10-CM | POA: Diagnosis present

## 2014-02-10 DIAGNOSIS — F329 Major depressive disorder, single episode, unspecified: Secondary | ICD-10-CM | POA: Diagnosis present

## 2014-02-10 DIAGNOSIS — N179 Acute kidney failure, unspecified: Secondary | ICD-10-CM | POA: Diagnosis present

## 2014-02-10 DIAGNOSIS — E871 Hypo-osmolality and hyponatremia: Secondary | ICD-10-CM

## 2014-02-10 DIAGNOSIS — M1990 Unspecified osteoarthritis, unspecified site: Secondary | ICD-10-CM | POA: Diagnosis present

## 2014-02-10 DIAGNOSIS — D329 Benign neoplasm of meninges, unspecified: Secondary | ICD-10-CM | POA: Diagnosis present

## 2014-02-10 DIAGNOSIS — R21 Rash and other nonspecific skin eruption: Secondary | ICD-10-CM

## 2014-02-10 DIAGNOSIS — M419 Scoliosis, unspecified: Secondary | ICD-10-CM | POA: Diagnosis present

## 2014-02-10 DIAGNOSIS — R404 Transient alteration of awareness: Secondary | ICD-10-CM

## 2014-02-10 DIAGNOSIS — Z87440 Personal history of urinary (tract) infections: Secondary | ICD-10-CM | POA: Diagnosis not present

## 2014-02-10 DIAGNOSIS — Y9289 Other specified places as the place of occurrence of the external cause: Secondary | ICD-10-CM

## 2014-02-10 DIAGNOSIS — T50905A Adverse effect of unspecified drugs, medicaments and biological substances, initial encounter: Secondary | ICD-10-CM | POA: Diagnosis present

## 2014-02-10 DIAGNOSIS — T3695XA Adverse effect of unspecified systemic antibiotic, initial encounter: Secondary | ICD-10-CM | POA: Diagnosis present

## 2014-02-10 DIAGNOSIS — G9340 Encephalopathy, unspecified: Principal | ICD-10-CM

## 2014-02-10 DIAGNOSIS — R443 Hallucinations, unspecified: Secondary | ICD-10-CM

## 2014-02-10 DIAGNOSIS — T887XXA Unspecified adverse effect of drug or medicament, initial encounter: Secondary | ICD-10-CM

## 2014-02-10 LAB — URINALYSIS, ROUTINE W REFLEX MICROSCOPIC
GLUCOSE, UA: NEGATIVE mg/dL
HGB URINE DIPSTICK: NEGATIVE
Ketones, ur: 15 mg/dL — AB
LEUKOCYTES UA: NEGATIVE
Nitrite: NEGATIVE
PROTEIN: 30 mg/dL — AB
SPECIFIC GRAVITY, URINE: 1.028 (ref 1.005–1.030)
Urobilinogen, UA: 0.2 mg/dL (ref 0.0–1.0)
pH: 6 (ref 5.0–8.0)

## 2014-02-10 LAB — COMPREHENSIVE METABOLIC PANEL
ALT: 14 U/L (ref 0–35)
AST: 22 U/L (ref 0–37)
Albumin: 4.1 g/dL (ref 3.5–5.2)
Alkaline Phosphatase: 70 U/L (ref 39–117)
Anion gap: 16 — ABNORMAL HIGH (ref 5–15)
BUN: 25 mg/dL — ABNORMAL HIGH (ref 6–23)
CHLORIDE: 97 meq/L (ref 96–112)
CO2: 23 mEq/L (ref 19–32)
Calcium: 9.8 mg/dL (ref 8.4–10.5)
Creatinine, Ser: 1.01 mg/dL (ref 0.50–1.10)
GFR calc Af Amer: 56 mL/min — ABNORMAL LOW (ref >90)
GFR calc non Af Amer: 48 mL/min — ABNORMAL LOW (ref >90)
Glucose, Bld: 166 mg/dL — ABNORMAL HIGH (ref 70–99)
Potassium: 3.9 mEq/L (ref 3.7–5.3)
Sodium: 136 mEq/L — ABNORMAL LOW (ref 137–147)
Total Bilirubin: 0.6 mg/dL (ref 0.3–1.2)
Total Protein: 7.1 g/dL (ref 6.0–8.3)

## 2014-02-10 LAB — CBC WITH DIFFERENTIAL/PLATELET
Basophils Absolute: 0 10*3/uL (ref 0.0–0.1)
Basophils Relative: 0 % (ref 0–1)
EOS ABS: 0.7 10*3/uL (ref 0.0–0.7)
Eosinophils Relative: 9 % — ABNORMAL HIGH (ref 0–5)
HCT: 39.3 % (ref 36.0–46.0)
Hemoglobin: 13.2 g/dL (ref 12.0–15.0)
Lymphocytes Relative: 11 % — ABNORMAL LOW (ref 12–46)
Lymphs Abs: 0.8 10*3/uL (ref 0.7–4.0)
MCH: 32.4 pg (ref 26.0–34.0)
MCHC: 33.6 g/dL (ref 30.0–36.0)
MCV: 96.6 fL (ref 78.0–100.0)
Monocytes Absolute: 0.7 10*3/uL (ref 0.1–1.0)
Monocytes Relative: 8 % (ref 3–12)
Neutro Abs: 5.6 10*3/uL (ref 1.7–7.7)
Neutrophils Relative %: 72 % (ref 43–77)
PLATELETS: 161 10*3/uL (ref 150–400)
RBC: 4.07 MIL/uL (ref 3.87–5.11)
RDW: 13 % (ref 11.5–15.5)
WBC: 7.8 10*3/uL (ref 4.0–10.5)

## 2014-02-10 LAB — URINE MICROSCOPIC-ADD ON

## 2014-02-10 LAB — MRSA PCR SCREENING: MRSA BY PCR: NEGATIVE

## 2014-02-10 LAB — AMMONIA: AMMONIA: 25 umol/L (ref 11–60)

## 2014-02-10 MED ORDER — WHITE PETROLATUM GEL
Status: AC
  Administered 2014-02-10: 0.2
  Filled 2014-02-10: qty 5

## 2014-02-10 MED ORDER — DIPHENHYDRAMINE HCL 25 MG PO CAPS
25.0000 mg | ORAL_CAPSULE | Freq: Once | ORAL | Status: AC
  Administered 2014-02-10: 25 mg via ORAL
  Filled 2014-02-10: qty 1

## 2014-02-10 MED ORDER — TRAZODONE HCL 50 MG PO TABS
50.0000 mg | ORAL_TABLET | Freq: Every day | ORAL | Status: DC
  Administered 2014-02-11 – 2014-02-12 (×2): 50 mg via ORAL
  Filled 2014-02-10 (×4): qty 1

## 2014-02-10 MED ORDER — FLUOCINONIDE-E 0.05 % EX CREA
TOPICAL_CREAM | Freq: Two times a day (BID) | CUTANEOUS | Status: DC
  Administered 2014-02-10 – 2014-02-11 (×2): 1 via TOPICAL
  Administered 2014-02-11: 13:00:00 via TOPICAL
  Administered 2014-02-12: 1 via TOPICAL
  Administered 2014-02-12 – 2014-02-13 (×2): via TOPICAL
  Filled 2014-02-10 (×3): qty 15

## 2014-02-10 MED ORDER — ENOXAPARIN SODIUM 40 MG/0.4ML ~~LOC~~ SOLN
40.0000 mg | SUBCUTANEOUS | Status: DC
  Administered 2014-02-10: 40 mg via SUBCUTANEOUS
  Filled 2014-02-10 (×2): qty 0.4

## 2014-02-10 MED ORDER — ONDANSETRON HCL 4 MG PO TABS: 4.0000 mg | ORAL_TABLET | Freq: Four times a day (QID) | ORAL | Status: DC | PRN

## 2014-02-10 MED ORDER — SODIUM CHLORIDE 0.9 % IV SOLN
INTRAVENOUS | Status: DC
  Administered 2014-02-10 – 2014-02-11 (×2): via INTRAVENOUS
  Administered 2014-02-11 – 2014-02-12 (×2): 1000 mL via INTRAVENOUS

## 2014-02-10 MED ORDER — ALPRAZOLAM 0.25 MG PO TABS
0.2500 mg | ORAL_TABLET | Freq: Two times a day (BID) | ORAL | Status: DC
  Administered 2014-02-10 – 2014-02-13 (×6): 0.25 mg via ORAL
  Filled 2014-02-10 (×7): qty 1

## 2014-02-10 MED ORDER — ONDANSETRON HCL 4 MG/2ML IJ SOLN: 4.0000 mg | Freq: Four times a day (QID) | INTRAMUSCULAR | Status: DC | PRN

## 2014-02-10 MED ORDER — SODIUM CHLORIDE 0.9 % IV SOLN
INTRAVENOUS | Status: AC
  Administered 2014-02-10: 17:00:00 via INTRAVENOUS

## 2014-02-10 MED ORDER — SODIUM CHLORIDE 0.9 % IV BOLUS (SEPSIS)
500.0000 mL | Freq: Once | INTRAVENOUS | Status: AC
Start: 2014-02-10 — End: 2014-02-10
  Administered 2014-02-10: 500 mL via INTRAVENOUS

## 2014-02-10 MED ORDER — ENSURE COMPLETE PO LIQD
237.0000 mL | Freq: Two times a day (BID) | ORAL | Status: DC
Start: 2014-02-11 — End: 2014-02-11
  Administered 2014-02-11 (×2): 237 mL via ORAL

## 2014-02-10 NOTE — ED Notes (Signed)
Per son, patient was seen here recently for a UTI, given IV antibiotics and sent home. States patient mental status continued to decline. Seen at urgent care and given PO antibiotics with no change. Pt. Also has a serous rash throughout torso and upper extremities, blisters noted to left hand. Alert and oriented to person, place, time. Disoriented to situation. Able to follow commands.

## 2014-02-10 NOTE — ED Provider Notes (Signed)
CSN: [id number]     Arrival date & time 02/10/14  1204 History   First MD Initiated Contact with Patient 02/10/14 1226     Chief Complaint  Patient presents with  . Altered Mental Status      HPI Per son, patient was seen here recently for a UTI, given IV antibiotics and sent home. States patient mental status continued to decline. Seen at urgent care and given PO antibiotics with no change. Pt. Also has a serous rash throughout torso and upper extremities, blisters noted to left hand. Alert and oriented to person, place, time. Disoriented to situation. Able to follow commands.  Past Medical History  Diagnosis Date  . Histoplasmosis   . Macular degeneration   . Arthritis     osteo  . Depression   . Anxiety   . UTI (urinary tract infection) 01/29/2014  . History of skin cancer     facail   Past Surgical History  Procedure Laterality Date  . Cosmetic surgery    . Sugery of female organs       1950's  . Eye surgery      detached retina    . Anal fissure repair    . Breast lumpectomy Left    Family History  Problem Relation Age of Onset  . Parkinsonism Father    History  Substance Use Topics  . Smoking status: Former Research scientist (life sciences)  . Smokeless tobacco: Never Used     Comment: qiuit smoking in 1992  . Alcohol Use: No   OB History    No data available     Review of Systems  All other systems reviewed and are negative  Allergies  Penicillins; Sulfur; and Levofloxacin  Home Medications   Prior to Admission medications   Medication Sig Start Date End Date Taking? Authorizing Provider  traZODone (DESYREL) 50 MG tablet Take 100 mg by mouth at bedtime.  12/23/13  Yes Historical Provider, MD  ALPRAZolam Duanne Moron) 0.5 MG tablet Take 0.5 tablets (0.25 mg total) by mouth 3 (three) times daily. 02/13/14   Kinnie Feil, MD  fluocinonide-emollient (LIDEX-E) 0.05 % cream Apply topically 2 (two) times daily. 02/13/14   Kinnie Feil, MD  predniSONE (DELTASONE) 20 MG tablet  Take 2 tablets (40 mg total) by mouth daily with breakfast. 02/13/14   Kinnie Feil, MD   BP 137/81 mmHg  Pulse 106  Temp(Src) 98.7 F (37.1 C) (Oral)  Resp 16  Ht 5\' 4"  (1.626 m)  Wt 116 lb 1.6 oz (52.663 kg)  BMI 19.92 kg/m2  SpO2 96% Physical Exam Physical Exam  Nursing note and vitals reviewed. Constitutional: She is oriented to person, place.. She appears well-developed and well-nourished. No distress.  HENT:  Head: Normocephalic and atraumatic.  Eyes: Pupils are equal, round, and reactive to light.  Neck: Normal range of motion.  Cardiovascular: Normal rate and intact distal pulses.   Pulmonary/Chest: No respiratory distress.  Abdominal: Normal appearance. She exhibits no distension.  Musculoskeletal: Normal range of motion.  Neurological: She is alert and oriented to person, place, and time. No cranial nerve deficit.  Skin: Skin is warm and dry.  Papular rash noted on trunk arms stomach and lower extremity.     ED Course  Procedures (including critical care time) Labs Review Labs Reviewed  COMPREHENSIVE METABOLIC PANEL - Abnormal; Notable for the following:    Sodium 136 (*)    Glucose, Bld 166 (*)    BUN 25 (*)    GFR calc  non Af Amer 48 (*)    GFR calc Af Amer 56 (*)    Anion gap 16 (*)    All other components within normal limits  CBC WITH DIFFERENTIAL - Abnormal; Notable for the following:    Lymphocytes Relative 11 (*)    Eosinophils Relative 9 (*)    All other components within normal limits  URINALYSIS, ROUTINE W REFLEX MICROSCOPIC - Abnormal; Notable for the following:    Color, Urine AMBER (*)    Bilirubin Urine SMALL (*)    Ketones, ur 15 (*)    Protein, ur 30 (*)    All other components within normal limits  URINE MICROSCOPIC-ADD ON - Abnormal; Notable for the following:    Casts HYALINE CASTS (*)    Crystals CA OXALATE CRYSTALS (*)    All other components within normal limits  CBC WITH DIFFERENTIAL - Abnormal; Notable for the following:     Eosinophils Relative 13 (*)    Eosinophils Absolute 0.8 (*)    All other components within normal limits  COMPREHENSIVE METABOLIC PANEL - Abnormal; Notable for the following:    Total Protein 5.9 (*)    GFR calc non Af Amer 75 (*)    GFR calc Af Amer 87 (*)    All other components within normal limits  MRSA PCR SCREENING  AMMONIA    Imaging Review Changes consistent with COPD otherwise unchanged   Patient admitted to the hospital.    MDM   Final diagnoses:  Altered mental state  Infection  Hallucination        Dot Lanes, MD 02/17/14 [phone]

## 2014-02-10 NOTE — ED Notes (Signed)
Pt here for confusion. Per family pt normally no this way. sts recently treated for UTI. Pt also has rash to body.

## 2014-02-10 NOTE — H&P (Signed)
History and Physical    Donna Webster JHE:[id number] DOB: [birth year] DOA: 02/10/2014  Referring physician: Dr. Audie Pinto PCP: Donna Smolder, MD  Specialists: none   Chief Comp`laint: Altered mental status  HPI: Donna Webster is a 80 y.o. female has a past medical history significant for prior infection with histoplasma, depression, anxiety, she was recently admitted to the hospital on 12/8 with confusion, and it was thought to be due to her urinary tract infection. She was discharged a day later, apparently at her insistence. Her son, who currently is in the room, tells me that over the last couple of weeks, since her discharge, she has been having persistent and somewhat worsening confusion. Patient is reluctant to admit this, however endorses intermittent problems with remembering things, and she also endorses hallucinations. She has insight that they are hallucinations. She used to be on Xanax 0.25, 3 times daily however about a month ago she apparently lost all her pills and was unable to have this medication refilled. Patient and patient's son do feel like there is a temporal relationship between the moment when she stopped exam in about 4 weeks ago, and when she started having cognition problems and intermittent hallucinations. Patient also endorses a rash, very pruritic, on her arms and legs, almost appearing confluent on the hands, that started about 2-3 weeks ago and has been worsened in the last 6 days. Other than antibiotics for the recent urinary tract infection she has not been exposed to any new medications. She used to take amitriptyline, however this was stopped a few days ago by her PCP. She endorses mild nausea, however denies abdominal pain, vomiting or diarrhea. She denies any chest pain or shortness of breath. She was reluctant to be admitted to the hospital and is asking specifically when can she go home. She was placed 3 days ago on levofloxacin by her primary care provider for  urinary tract infection. In the emergency room, her labs are pertinent for mild hyponatremia with a sodium of 136, mild dehydration with a BUN of 25 and creatinine 1.01, her CBC is unremarkable however does have elevated eosinophils on the differential. Her urinalysis is negative for leukocytes or nitrates and is fairly unremarkable. [facility] asked for admission for acute on chronic encephalopathy    Review of Systems: as per HPI otherwise negative    Past Medical History  Diagnosis Date  . Histoplasmosis   . Macular degeneration   . Arthritis     osteo  . Depression   . Anxiety   . UTI (urinary tract infection) 01/29/2014  . History of skin cancer     facail   Past Surgical History  Procedure Laterality Date  . Cosmetic surgery    . Sugery of female organs       1950's  . Eye surgery      detached retina    . Anal fissure repair     Social History:  reports that she has quit smoking. She has never used smokeless tobacco. She reports that she does not drink alcohol or use illicit drugs.  Allergies  Allergen Reactions  . Penicillins Other (See Comments)    Severe constipation / stomach cramps  . Sulfur Other (See Comments)    Severe stomach pain    Family History  Problem Relation Age of Onset  . Parkinsonism Father     Prior to Admission medications   Medication Sig Start Date End Date Taking? Authorizing Provider  Donna Webster) 0.5 MG  tablet Take 0.5 mg by mouth 3 (three) times daily.  01/10/14  Yes Historical Provider, MD  levofloxacin (LEVAQUIN) 250 MG tablet Take 250 mg by mouth daily. 02/08/14  Yes Historical Provider, MD  traZODone (DESYREL) 50 MG tablet Take 100 mg by mouth at bedtime.  12/23/13  Yes Historical Provider, MD   Physical Exam: Filed Vitals:   02/10/14 1400 02/10/14 1415 02/10/14 1500 02/10/14 1530  BP: 144/92 150/81 113/48 133/75  Pulse: 83 89 82   Temp:      TempSrc:      Resp: 13 18 20 18   SpO2: 100% 97% 96%      General:  No apparent  distress, pleasant caucasian female with mild confusion  Eyes: PERRL, no scleral icterus  ENT: moist oropharynx  Neck: supple, no JVD  Cardiovascular: regular rate without MRG; 2+ peripheral pulses  Respiratory: CTA biL, good air movement without wheezing, rhonchi or crackled  Abdomen: soft, non tender to palpation, positive bowel sounds, no guarding, no rebound  Skin: scattered maculopapular rash on arms, legs, chest and scant on her back. Confluent on hands with vesicles on the dorsum of her left hand   Musculoskeletal: no peripheral edema  Psychiatric: normal mood and affect  Neurologic: nonfocal   Labs on Admission:  Basic Metabolic Panel:  Recent Labs Lab 02/10/14 1229  NA 136*  K 3.9  CL 97  CO2 23  GLUCOSE 166*  BUN 25*  CREATININE 1.01  CALCIUM 9.8   Liver Function Tests:  Recent Labs Lab 02/10/14 1229  AST 22  ALT 14  ALKPHOS 70  BILITOT 0.6  PROT 7.1  ALBUMIN 4.1    Recent Labs Lab 02/10/14 1229  AMMONIA 25   CBC:  Recent Labs Lab 02/10/14 1229  WBC 7.8  NEUTROABS 5.6  HGB 13.2  HCT 39.3  MCV 96.6  PLT 161   EKG: Independently reviewed.  Assessment/Plan Active Problems:   Hyponatremia   Altered mental state   Acute encephalopathy   Rash and nonspecific skin eruption   Drug reaction   Acute encephalopathy - patient with intermittent mental status changes, progressively worse over the last 3-4 weeks, started once her Xanax which she has been on for the past 15 years was suddenly stopped, she also experiencing hallucinations, I'm concerned about her having withdrawal symptoms from her chronic benzodiazepine, will start Xanax at a low dose of 0.25 scheduled twice a day and monitor her mental status  - if mental status changes persist will consider brain MRI  Skin rash - this looks like a drug reaction, possibly levofloxacin which he has been on last time she was hospitalized as well as in the last few days. She does have  eosinophilia and skin involvement, which raises the concern for DRESS, however she has no organ involvement, no severe renal dysfunction or elevated liver enzymes so this would qualify as mild, will focus on skin symptomatic relief. - Start topical high-dose steroids  Mild AKI - likely secondary to dehydration with elevated BUN to creatinine ratio, with roughly half her GFR since last time she was here - IV fluids, repeat renal function in the morning  Weakness/dehydration - poor po intake, +ketones in urine, PT consult  Mild hyponatremia - during last hospitalization Na 131, her cortisol was checked returned at 15.7, TSH normal at 1.18 - monitor after IVF   Diet:  regular diet Fluids: Normal saline DVT Prophylaxis: Lovenox  Code status: Full code Family Communication: discussed with son in the room  Disposition: Admit to Med-Surg   Time spent: 75  Costin M. Cruzita Lederer, MD [facility] Pager [phone]  If 7PM-7AM, please contact night-coverage [email or web address] Password [facility] 02/10/2014, 3:57 PM

## 2014-02-10 NOTE — ED Notes (Signed)
Placed pt on bedpan. No urine was obtained

## 2014-02-10 NOTE — Progress Notes (Signed)
Patient refusing evening dose of trazodone.  Patient states, "you won't get anywhere near me with that".  Patient reoriented, but adamant that she is in her kitchen at [facility].  Patient states, "stop talking, just leave me alone".  Will continue to monitor.

## 2014-02-11 ENCOUNTER — Inpatient Hospital Stay (HOSPITAL_COMMUNITY): Payer: Commercial Managed Care - HMO

## 2014-02-11 DIAGNOSIS — T887XXD Unspecified adverse effect of drug or medicament, subsequent encounter: Secondary | ICD-10-CM

## 2014-02-11 DIAGNOSIS — E43 Unspecified severe protein-calorie malnutrition: Secondary | ICD-10-CM | POA: Insufficient documentation

## 2014-02-11 LAB — COMPREHENSIVE METABOLIC PANEL
ALT: 15 U/L (ref 0–35)
ANION GAP: 9 (ref 5–15)
AST: 26 U/L (ref 0–37)
Albumin: 3.7 g/dL (ref 3.5–5.2)
Alkaline Phosphatase: 56 U/L (ref 39–117)
BILIRUBIN TOTAL: 0.8 mg/dL (ref 0.3–1.2)
BUN: 15 mg/dL (ref 6–23)
CHLORIDE: 105 meq/L (ref 96–112)
CO2: 23 mmol/L (ref 19–32)
CREATININE: 0.71 mg/dL (ref 0.50–1.10)
Calcium: 8.8 mg/dL (ref 8.4–10.5)
GFR calc non Af Amer: 75 mL/min — ABNORMAL LOW (ref >90)
GFR, EST AFRICAN AMERICAN: 87 mL/min — AB (ref >90)
GLUCOSE: 98 mg/dL (ref 70–99)
Potassium: 3.9 mmol/L (ref 3.5–5.1)
Sodium: 137 mmol/L (ref 135–145)
Total Protein: 5.9 g/dL — ABNORMAL LOW (ref 6.0–8.3)

## 2014-02-11 LAB — CBC WITH DIFFERENTIAL/PLATELET
Basophils Absolute: 0 10*3/uL (ref 0.0–0.1)
Basophils Relative: 0 % (ref 0–1)
Eosinophils Absolute: 0.8 10*3/uL — ABNORMAL HIGH (ref 0.0–0.7)
Eosinophils Relative: 13 % — ABNORMAL HIGH (ref 0–5)
HEMATOCRIT: 38.8 % (ref 36.0–46.0)
HEMOGLOBIN: 13 g/dL (ref 12.0–15.0)
LYMPHS ABS: 0.9 10*3/uL (ref 0.7–4.0)
LYMPHS PCT: 14 % (ref 12–46)
MCH: 32.3 pg (ref 26.0–34.0)
MCHC: 33.5 g/dL (ref 30.0–36.0)
MCV: 96.5 fL (ref 78.0–100.0)
MONO ABS: 0.5 10*3/uL (ref 0.1–1.0)
MONOS PCT: 8 % (ref 3–12)
NEUTROS ABS: 4.2 10*3/uL (ref 1.7–7.7)
Neutrophils Relative %: 65 % (ref 43–77)
Platelets: 156 10*3/uL (ref 150–400)
RBC: 4.02 MIL/uL (ref 3.87–5.11)
RDW: 13 % (ref 11.5–15.5)
WBC: 6.4 10*3/uL (ref 4.0–10.5)

## 2014-02-11 MED ORDER — HALOPERIDOL LACTATE 5 MG/ML IJ SOLN
2.0000 mg | Freq: Once | INTRAMUSCULAR | Status: AC
  Administered 2014-02-11: 2 mg via INTRAVENOUS
  Filled 2014-02-11: qty 1

## 2014-02-11 MED ORDER — ENOXAPARIN SODIUM 30 MG/0.3ML ~~LOC~~ SOLN
30.0000 mg | SUBCUTANEOUS | Status: DC
  Administered 2014-02-11 – 2014-02-12 (×2): 30 mg via SUBCUTANEOUS
  Filled 2014-02-11 (×3): qty 0.3

## 2014-02-11 MED ORDER — ENSURE COMPLETE PO LIQD
237.0000 mL | Freq: Three times a day (TID) | ORAL | Status: DC
  Administered 2014-02-12 – 2014-02-13 (×4): 237 mL via ORAL

## 2014-02-11 NOTE — Progress Notes (Signed)
PT Cancellation Note  Patient Details Name: Donna Webster MRN: [id number] DOB: [birth year]   Cancelled Treatment:    Reason Eval/Treat Not Completed: Patient declined, no reason specified.  Started shaking her head on entering, and kept saying No to each attempt to do some therapy. 02/11/2014  [name], [employer] [phone]  (pager)   [name], [name] 02/11/2014, 4:40 PM
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
## 2014-02-11 NOTE — Progress Notes (Signed)
PROGRESS NOTE  Donna Webster LZJ:[id number] DOB: 1923/12/23 DOA: 02/10/2014 PCP: Donna Smolder, MD  HPI: Donna Webster is a 80 y.o. female has a past medical history significant for prior infection with histoplasma, depression, anxiety, she was recently admitted to the hospital on 12/8 with confusion, and it was thought to be due to her urinary tract infection. She was discharged a day later and now presents again with worsening confusion.   Subjective / 24 H Interval events Confused this morning, refusing her medications  Assessment/Plan: Active Problems:   Hyponatremia   Altered mental state   Acute encephalopathy   Rash and nonspecific skin eruption   Drug reaction  Acute encephalopathy - patient with intermittent mental status changes, progressively worse over the last 3-4 weeks, started once her Xanax which she has been on for the past 15 years was suddenly stopped, she also experiencing hallucinations, I'm concerned about her having withdrawal symptoms from her chronic benzodiazepine, - I started her back on Xanax, however patient refused taking this medication up until this morning - She underwent an MRI of the brain which showed a possible 2 cm meningioma arising from the left orbital roof, this is unlikely to cause her encephalopathy. - Sundowning last night, continue to monitor  Skin rash - this looks like a drug reaction, possibly levofloxacin which he has been on last time she was hospitalized as well as in the last few days. She does have eosinophilia and skin involvement, which raises the concern for DRESS, however she has no organ involvement, no severe renal dysfunction or elevated liver enzymes so this would qualify as mild, will focus on skin symptomatic relief. - Started topical high-dose steroids,  with improvement this morning (  Mild AKI - likely secondary to dehydration with elevated BUN to creatinine ratio, with roughly half her GFR since last time she was  here - her renal function improved this morning after hydration   Weakness/dehydration - poor po intake, +ketones in urine, PT consult pending   Mild hyponatremia - during last hospitalization Na 131, her cortisol was checked returned at 15.7, TSH normal at 1.18. - Sodium improved after IV fluids    Diet: Diet Heart Fluids: none  DVT Prophylaxis: Lovenox  Code Status: Full Code Family Communication: d/w son  Disposition Plan: inpatient  Consultants:  None   Procedures:  None    Antibiotics  Anti-infectives    None       Studies  Dg Chest 2 View  02/10/2014   CLINICAL DATA:  Former smoker, altered mental status for 10 days since being treated for a UTI  EXAM: CHEST  2 VIEW  COMPARISON:  None  FINDINGS: Upper normal heart size.  Calcified tortuous aorta.  Mediastinal contours and pulmonary vascularity normal.  Emphysematous changes consistent with COPD.  No acute infiltrate, pleural effusion or pneumothorax.  Bones demineralized with thoracolumbar scoliosis.  IMPRESSION: COPD changes.  No acute abnormalities.   Electronically Signed   By: Lavonia Dana M.D.   On: 02/10/2014 16:17     Objective  Filed Vitals:   02/10/14 1530 02/10/14 1659 02/10/14 2209 02/11/14 0654  BP: 133/75 140/72 132/58 135/71  Pulse:  78    Temp:  98.5 F (36.9 C) 97.8 F (36.6 C) 98.1 F (36.7 C)  TempSrc:  Oral Oral Oral  Resp: 18 20 18 18   Height:  5\' 4"  (1.626 m)    Weight:  52.663 kg (116 lb 1.6 oz)    SpO2:  100% 91% 90%    Intake/Output Summary (Last 24 hours) at 02/11/14 1311 Last data filed at 02/11/14 1031  Gross per 24 hour  Intake 1693.67 ml  Output    100 ml  Net 1593.67 ml   Filed Weights   02/10/14 1659  Weight: 52.663 kg (116 lb 1.6 oz)    Exam:  General:  NAD, confused  Cardiovascular: RRR  Respiratory: CTA biL  Abdomen: soft, non tender  MSK: no edema  Neuro: non focal  Data Reviewed: Basic Metabolic Panel:  Recent Labs Lab 02/10/14 1229  02/11/14 0615  NA 136* 137  K 3.9 3.9  CL 97 105  CO2 23 23  GLUCOSE 166* 98  BUN 25* 15  CREATININE 1.01 0.71  CALCIUM 9.8 8.8   Liver Function Tests:  Recent Labs Lab 02/10/14 1229 02/11/14 0615  AST 22 26  ALT 14 15  ALKPHOS 70 56  BILITOT 0.6 0.8  PROT 7.1 5.9*  ALBUMIN 4.1 3.7    Recent Labs Lab 02/10/14 1229  AMMONIA 25   CBC:  Recent Labs Lab 02/10/14 1229 02/11/14 0615  WBC 7.8 6.4  NEUTROABS 5.6 4.2  HGB 13.2 13.0  HCT 39.3 38.8  MCV 96.6 96.5  PLT 161 156    Recent Results (from the past 240 hour(s))  MRSA PCR Screening     Status: None   Collection Time: 02/10/14  8:33 PM  Result Value Ref Range Status   MRSA by PCR NEGATIVE NEGATIVE Final    Comment:        The GeneXpert MRSA Assay (FDA approved for NASAL specimens only), is one component of a comprehensive MRSA colonization surveillance program. It is not intended to diagnose MRSA infection nor to guide or monitor treatment for MRSA infections.      Scheduled Meds: . ALPRAZolam  0.25 mg Oral BID  . enoxaparin (LOVENOX) injection  40 mg Subcutaneous Q24H  . feeding supplement (ENSURE COMPLETE)  237 mL Oral BID BM  . fluocinonide-emollient   Topical BID  . traZODone  50 mg Oral QHS   Continuous Infusions: . sodium chloride 75 mL/hr at 02/11/14 3545    Time spent: 25 minutes  Marzetta Board, MD [facility] Pager [phone]. If 7 PM - 7 AM, please contact night-coverage at [email or web address], password [email or web address] 02/11/2014, 1:11 PM  LOS: 1 day

## 2014-02-12 DIAGNOSIS — E43 Unspecified severe protein-calorie malnutrition: Secondary | ICD-10-CM

## 2014-02-12 NOTE — Evaluation (Signed)
Physical Therapy Evaluation Patient Details Name: Donna Webster MRN: [id number] DOB: August 05, 1923 Today's Date: 02/12/2014   History of Present Illness  80 yo female who lives at [name] apparently was somewhat confused today.  Pt c/o slight dysuria,  Pt brought to ED for evaluation and CT brain negative, and found to have uti, hyperglycemia, and mild hyponatremia. Pt with some dizziness, carotids to be checked.  Clinical Impression  Pt admitted with/for AMS.  At this time pt is not ready to be independent in her home.  Pt currently limited functionally due to the problems listed. ( See problems list.)   Pt will benefit from PT to maximize function and safety in order to get ready for next venue listed below.     Follow Up Recommendations SNF;Other (comment) (Needs more assist at home if she is adamant about going home)    Equipment Recommendations  None recommended by PT    Recommendations for Other Services       Precautions / Restrictions Precautions Precautions: Fall      Mobility  Bed Mobility Overal bed mobility: Needs Assistance Bed Mobility: Supine to Sit     Supine to sit: Min guard     General bed mobility comments: heavy use of the rail she doesn't have at home.  Transfers Overall transfer level: Needs assistance Equipment used: 4-wheeled walker Transfers: Sit to/from Stand Sit to Stand: Min assist            Ambulation/Gait Ambulation/Gait assistance: Min assist Ambulation Distance (Feet): 24 Feet Assistive device: 4-wheeled walker Gait Pattern/deviations: Step-through pattern;Decreased step length - right;Decreased step length - left;Decreased stride length;Shuffle;Trunk flexed Gait velocity: decreased   General Gait Details: Very tentative gait, with pt expressing not confident weightbearing on the R LE  Stairs            Wheelchair Mobility    Modified Rankin (Stroke Patients Only)       Balance Overall balance assessment:  Needs assistance Sitting-balance support: No upper extremity supported Sitting balance-Leahy Scale: Fair     Standing balance support: No upper extremity supported Standing balance-Leahy Scale: Poor                               Pertinent Vitals/Pain Pain Assessment: No/denies pain    Home Living Family/patient expects to be discharged to:: Private residence Living Arrangements: Alone Available Help at Discharge: Family;Available PRN/intermittently Type of Home: Apartment Home Access: Level entry     Home Layout: One level Home Equipment: Walker - 4 wheels Additional Comments: pt wears sunglasses when out in hallway for eye protection, almost blind    Prior Function Level of Independence: Independent with assistive device(s)         Comments: takes care of herself within her apt, walks with her RW to dining room for meals, does not have to clean her apt     Hand Dominance        Extremity/Trunk Assessment   Upper Extremity Assessment: Defer to OT evaluation           Lower Extremity Assessment: Overall WFL for tasks assessed;Generalized weakness RLE Deficits / Details: R LE mildly weaker than L LE    Cervical / Trunk Assessment: Kyphotic (scoliotic)  Communication   Communication: No difficulties  Cognition Arousal/Alertness: Awake/alert Behavior During Therapy: WFL for tasks assessed/performed Overall Cognitive Status: Impaired/Different from baseline Area of Impairment: Memory;Safety/judgement     Memory: Decreased  short-term memory   Safety/Judgement: Decreased awareness of safety          General Comments General comments (skin integrity, edema, etc.): pt distractible and easily agitated.  Short term memory deficits    Exercises        Assessment/Plan    PT Assessment Patient needs continued PT services  PT Diagnosis Abnormality of gait;Generalized weakness   PT Problem List Decreased strength;Decreased activity  tolerance;Decreased balance;Decreased mobility  PT Treatment Interventions DME instruction;Gait training;Functional mobility training;Therapeutic activities;Therapeutic exercise;Balance training;Patient/family education   PT Goals (Current goals can be found in the Care Plan section) Acute Rehab PT Goals Patient Stated Goal: return to [facility] PT Goal Formulation: With patient Time For Goal Achievement: 02/26/14 Potential to Achieve Goals: Good    Frequency Min 3X/week   Barriers to discharge Decreased caregiver support      Co-evaluation               End of Session     Patient left: in chair;with call bell/phone within reach;with chair alarm set Nurse Communication: Mobility status         Time: 8657-8469 PT Time Calculation (min) (ACUTE ONLY): 34 min   Charges:   PT Evaluation $Initial PT Evaluation Tier I: 1 Procedure PT Treatments $Gait Training: 8-22 mins $Therapeutic Activity: 8-22 mins   PT G Codes:          Deloris Moger, [name] 02/12/2014, 12:29 PM 02/12/2014  [name], PT [phone]  (pager)

## 2014-02-12 NOTE — Progress Notes (Signed)
[facility] PROGRESS NOTE  ZABELLA WEASE WIO:[id number] DOB: 07/17/1923 DOA: 02/10/2014 PCP: Marjorie Smolder, MD   brief narrative Donna Webster is a 80 y.o. female with history of infection with histoplasma, depression, anxiety, recently admitted to the hospital on 12/8 with confusion, and it was thought to be due to her urinary tract infection. She was discharged the next day or 2 days after her discharge she targeted having worsening confusion. Patient used to be on Xanax 0.25 mg 3 times a day for several years but for the past month or so has lost all her pills and had not refilled her medications. She has been having problems with cognition and intermittent hallucinations since she stopped her Xanax. She also had a pruritic rash in her thumbs and index for the past 3 weeks. Patient admitted for further workup and management.  Assessment/Plan: Acute encephalopathy Patient presented with intermittent confusion with hallucinations progressive over the last 3-4 weeks. Patient has been on Xanax for the past 15 years which was abruptly stopped and possibly contributing to her symptoms with benzodiazepine withdrawals. -Her Xanax has been started back on admission. Her mental status is now clearing although has sundowning during the evening. -MRI of the brain was done on admission which showed a possible 2 cm meningioma arising from the left orbital roof which is unlikely contributing to her encephalopathy.  Diffuse skin rash Appears to be a drug reaction possibly due to quinolones which she received during recent hospitalization for UTI. Patient does have eosinophilia but does not have any systemic organs involvement, renal dysfunction or transaminitis. Patient was placed on high-dose topical steroid. Does not complain of itching at this time. Monitor clinically  Weakness and dehydration Secondary to poor by mouth intake. Even IV hydration. Seen by physical therapy and Skilled nursing  facility versus higher level of care at her independent living. Will consult social work. Opinion protein supplements  Anxiety Resume Xanax , 0.25 mg twice a day.Marland Kitchen Resume Trazodone   Hyponatremia and mild acute kidney injury on admission Likely secondary to dehydration and has improved with hydration  DVT prophylaxis: Subcutaneous Lovenox  Diet: Regular  Code Status: Full code Family Communication: With son [name] on the phone Disposition Plan: Skilled nursing facility versus pending living with active physical therapy and RN.   Consultants:  None  Procedures: MRI brain  antibiotics: None  HPI/Subjective:  Patient seen and examined. Reportedly was confused during the evening. Appears quite oriented to baseline this morning. Denies any symptoms.  Objective: Filed Vitals:   02/12/14 0620  BP: 146/61  Pulse: 93  Temp: 98.5 F (36.9 C)  Resp: 16    Intake/Output Summary (Last 24 hours) at 02/12/14 1258 Last data filed at 02/12/14 1154  Gross per 24 hour  Intake 1493.25 ml  Output    200 ml  Net 1293.25 ml   Filed Weights   02/10/14 1659  Weight: 52.663 kg (116 lb 1.6 oz)    Exam:   General:   elderly thin built female in no acute distress  HEENT: No pallor, moist oral mucosa  Chest: Clear to auscultation bilaterally  CVS: Normal S1 and S2, no murmurs  Abdomen: Soft, nondistended, nontender  Extremities: Diffuse macular rash over the forearms and legs with some warmth but no tenderness  DNS: Alert and oriented X2, nonfocal  Data Reviewed: Basic Metabolic Panel:  Recent Labs Lab 02/10/14 1229 02/11/14 0615  NA 136* 137  K 3.9 3.9  CL 97 105  CO2 23 23  GLUCOSE 166* 98  BUN 25* 15  CREATININE 1.01 0.71  CALCIUM 9.8 8.8   Liver Function Tests:  Recent Labs Lab 02/10/14 1229 02/11/14 0615  AST 22 26  ALT 14 15  ALKPHOS 70 56  BILITOT 0.6 0.8  PROT 7.1 5.9*  ALBUMIN 4.1 3.7   No results for input(s): LIPASE, AMYLASE in the last  168 hours.  Recent Labs Lab 02/10/14 1229  AMMONIA 25   CBC:  Recent Labs Lab 02/10/14 1229 02/11/14 0615  WBC 7.8 6.4  NEUTROABS 5.6 4.2  HGB 13.2 13.0  HCT 39.3 38.8  MCV 96.6 96.5  PLT 161 156   Cardiac Enzymes: No results for input(s): CKTOTAL, CKMB, CKMBINDEX, TROPONINI in the last 168 hours. BNP (last 3 results) No results for input(s): PROBNP in the last 8760 hours. CBG: No results for input(s): GLUCAP in the last 168 hours.  Recent Results (from the past 240 hour(s))  MRSA PCR Screening     Status: None   Collection Time: 02/10/14  8:33 PM  Result Value Ref Range Status   MRSA by PCR NEGATIVE NEGATIVE Final    Comment:        The GeneXpert MRSA Assay (FDA approved for NASAL specimens only), is one component of a comprehensive MRSA colonization surveillance program. It is not intended to diagnose MRSA infection nor to guide or monitor treatment for MRSA infections.      Studies: Dg Chest 2 View  02/10/2014   CLINICAL DATA:  Former smoker, altered mental status for 10 days since being treated for a UTI  EXAM: CHEST  2 VIEW  COMPARISON:  None  FINDINGS: Upper normal heart size.  Calcified tortuous aorta.  Mediastinal contours and pulmonary vascularity normal.  Emphysematous changes consistent with COPD.  No acute infiltrate, pleural effusion or pneumothorax.  Bones demineralized with thoracolumbar scoliosis.  IMPRESSION: COPD changes.  No acute abnormalities.   Electronically Signed   By: Lavonia Dana M.D.   On: 02/10/2014 16:17   Mr Brain Wo Contrast  02/11/2014   CLINICAL DATA:  80 year old female with altered mental status, progressively worse over the past 3 - 4 weeks. Hallucinations. Chronic benzodiazepine use. Subsequent encounter.  EXAM: MRI HEAD WITHOUT CONTRAST  TECHNIQUE: Multiplanar, multiecho pulse sequences of the brain and surrounding structures were obtained without intravenous contrast.  COMPARISON:  01/28/2014 CT.  No comparison MR.  FINDINGS:  Exam is motion degraded.  2 cm region of altered signal intensity anterior inferior left frontal lobe. My suspicion is that this may represent a meningioma arising from the left orbital roof. Contrast-enhanced MR with attention to this region would prove helpful. Mild displacement of surrounding brain parenchyma without significant vasogenic edema.  No acute infarct.  No intracranial hemorrhage.  Moderate to marked small vessel disease type changes.  Major intracranial vascular structures are patent and ectatic.  Global atrophy without hydrocephalus.  Banding left globe with distorted configuration of the left globe.  Mild cervical spondylotic changes. Cervical medullary junction, pituitary region and pineal region unremarkable.  IMPRESSION: Exam is motion degraded.  2 cm meningioma arising from the left orbital roof suspected as noted above. Contrast-enhanced MR with attention to this region would prove helpful. Mild displacement of surrounding brain parenchyma without significant vasogenic edema.  No acute infarct.  No intracranial hemorrhage.  Moderate to marked small vessel disease type changes.  Global atrophy without hydrocephalus.  Banding left globe with distorted configuration of the left globe.   Electronically Signed   By: Richardson Landry  Jeannine Kitten M.D.   On: 02/11/2014 13:31    Scheduled Meds: . ALPRAZolam  0.25 mg Oral BID  . enoxaparin (LOVENOX) injection  30 mg Subcutaneous Q24H  . feeding supplement (ENSURE COMPLETE)  237 mL Oral TID BM  . fluocinonide-emollient   Topical BID  . traZODone  50 mg Oral QHS   Continuous Infusions: . sodium chloride 1,000 mL (02/12/14 0730)       Time spent: 25 minutes    Chelsee Hosie  [facility] Pager [phone] If 7PM-7AM, please contact night-coverage at [email or web address], password [facility] 02/12/2014, 12:58 PM  LOS: 2 days

## 2014-02-13 MED ORDER — ALPRAZOLAM 0.5 MG PO TABS: 0.2500 mg | ORAL_TABLET | Freq: Three times a day (TID) | ORAL | Status: DC

## 2014-02-13 MED ORDER — PREDNISONE 20 MG PO TABS
40.0000 mg | ORAL_TABLET | Freq: Once | ORAL | Status: DC
  Filled 2014-02-13: qty 2

## 2014-02-13 MED ORDER — PREDNISONE 20 MG PO TABS: 40.0000 mg | ORAL_TABLET | Freq: Every day | ORAL | Status: DC

## 2014-02-13 MED ORDER — FLUOCINONIDE-E 0.05 % EX CREA: TOPICAL_CREAM | Freq: Two times a day (BID) | CUTANEOUS | Status: DC

## 2014-02-13 NOTE — Progress Notes (Addendum)
NURSING PROGRESS NOTE  [name] [id number] Discharge Data: 02/13/2014 3:43 PM Attending Provider: Kinnie Feil, MD IDH:[id number] Rod Holler, MD     [name] to be D/C'd SNF per MD order.  Discussed with the patient the After Visit Summary and all questions fully answered. All IV's discontinued with no bleeding noted. All belongings returned to patient for patient to take home.   Last Vital Signs:  Blood pressure 137/81, pulse 106, temperature 98.7 F (37.1 C), temperature source Oral, resp. rate 16, height 5\' 4"  (1.626 m), weight 52.663 kg (116 lb 1.6 oz), SpO2 96 %.  Discharge Medication List   Medication List    STOP taking these medications        levofloxacin 250 MG tablet  Commonly known as:  LEVAQUIN      TAKE these medications        ALPRAZolam 0.5 MG tablet  Commonly known as:  XANAX  Take 0.5 tablets (0.25 mg total) by mouth 3 (three) times daily.     fluocinonide-emollient 0.05 % cream  Commonly known as:  LIDEX-E  Apply topically 2 (two) times daily.     predniSONE 20 MG tablet  Commonly known as:  DELTASONE  Take 2 tablets (40 mg total) by mouth daily with breakfast.     traZODone 50 MG tablet  Commonly known as:  DESYREL  Take 100 mg by mouth at bedtime.

## 2014-02-13 NOTE — Clinical Social Work Note (Signed)
Clinical Social Work Department CLINICAL SOCIAL WORK PLACEMENT NOTE 02/13/2014  Patient:  Donna Webster, Donna Webster  Account Number:  [id number] Admit date:  02/10/2014  Clinical Social Worker:  Kemper Durie, [place]  Date/time:  02/13/2014 02:34 PM  Clinical Social Work is seeking post-discharge placement for this patient at the following level of care:   [place]   (*CSW will update this form in Epic as items are completed)   02/13/2014  Patient/family provided with [facility] Department of Clinical Social Work's list of facilities offering this level of care within the geographic area requested by the patient (or if unable, by the patient's family).  02/13/2014  Patient/family informed of their freedom to choose among providers that offer the needed level of care, that participate in Medicare, Medicaid or managed care program needed by the patient, have an available bed and are willing to accept the patient.  02/13/2014  Patient/family informed of [facility]' ownership interest in [facility] as well as of the fact that they are under no obligation to receive care at this facility.  PASARR submitted to EDS on 02/13/2014 PASARR number received on 02/13/2014  FL2 transmitted to all facilities in geographic area requested by pt/family on  02/13/2014 FL2 transmitted to all facilities within larger geographic area on   Patient informed that his/her managed care company has contracts with or will negotiate with  certain facilities, including the following:     Patient/family informed of bed offers received:  02/13/2014 Patient chooses bed at [facility]ician recommends and patient chooses bed at    Patient to be transferred to [facility] on  02/13/2014 Patient to be transferred to facility by Patient's children will transport patient Patient and family notified of transfer on 02/13/2014 Name of family member  notified:  [name]  The following physician request were entered in Epic:   Additional Comments:   Per MD patient ready for DC to [facility], patient, patient's family, and facility notified of DC. RN given number for report (on DC packet [phone]). DC packet on chart. Patient's children state they will transport the patient. [place] has been received Cephus Richer with [employer] states that she has left message with auth information for [facility]. CSW educated family on DC process and explained that the patient's RN will give them a DC packet which must be taken to the facility with the patient. Patient's RN at lunch when CSW brought DC packet to floor, CSW made covering RN aware of situation. CSW signing off.    Liz Beach MSW, [place], [place], [phone]

## 2014-02-13 NOTE — Discharge Summary (Signed)
Physician Discharge Summary  Donna Webster XFG:[id number] DOB: 02-25-1923 DOA: 02/10/2014  PCP: Marjorie Smolder, MD  Admit date: 02/10/2014 Discharge date: 02/13/2014  Time spent: >35 minutes  Recommendations for Outpatient Follow-up:  SNF F/u with PCP in 1 week F/u with neurosurgery in 4-6 weeks   Discharge Diagnoses:  Active Problems:   Hyponatremia   Altered mental state   Acute encephalopathy   Rash and nonspecific skin eruption   Drug reaction   Protein-calorie malnutrition, severe   Discharge Condition: stable   Diet recommendation: regular   Filed Weights   02/10/14 1659  Weight: 52.663 kg (116 lb 1.6 oz)    History of present illness:  Donna Webster is a 80 y.o. female with history of infection with histoplasma, depression, anxiety, recently admitted to the hospital on 12/8 with confusion, and it was thought to be due to her urinary tract infection. She was discharged the next day or 2 days after her discharge she targeted having worsening confusion. Patient used to be on Xanax 0.25 mg 3 times a day for several years but for the past month or so has lost all her pills and had not refilled her medications. She has been having problems with cognition and intermittent hallucinations since she stopped her Xanax. She also had a pruritic rash in her thumbs and index for the past 3 weeks. Patient admitted for further workup and management.  Hospital Course:   Acute encephalopathy Patient presented with intermittent confusion with hallucinations progressive over the last 3-4 weeks. Patient has been on Xanax for the past 15 years which was abruptly stopped and possibly contributing to her symptoms with benzodiazepine withdrawals. -Her Xanax has been started back on admission. Her mental status is now clearing; MRI of the brain was done on admission which showed a possible 2 cm meningioma arising from the left orbital roof which is unlikely contributing to her encephalopathy.  D/w patient, her family recommended to f/u with neurology, neurosurgereon  as outpatient   1. Diffuse skin rash -Appears to be a drug reaction possibly due to quinolones which she received during recent hospitalization for UTI.  -clinically improving, cont steroid taper   2. Weakness and dehydration; clinically improving; physical therapy: SNF rehab  3. Anxiety Resume Xanax , Trazodone 4. Hyponatremia and mild acute kidney injury on admission Likely secondary to dehydration and has improved with hydration 5. Recent UTI, repeat UA is unremarkable; afebrile; no leukocytosis   D/w patient, her family at the bedside;  -being d/c SNF  Procedures:  none (i.e. Studies not automatically included, echos, thoracentesis, etc; not x-rays)  Consultations:  none  Discharge Exam: Filed Vitals:   02/13/14 0519  BP: 140/96  Pulse: 107  Temp: 98.9 F (37.2 C)  Resp: 18    General: alert, oriented  Cardiovascular: s1,s2 rrr Respiratory: CTA BL  Discharge Instructions  Discharge Instructions    Diet - low sodium heart healthy    Complete by:  As directed      Discharge instructions    Complete by:  As directed   Follow up with PCP in 1 week  Follow up with neurosurgery in 4-6 weeks     Increase activity slowly    Complete by:  As directed             Medication List    STOP taking these medications        levofloxacin 250 MG tablet  Commonly known as:  LEVAQUIN      TAKE  these medications        ALPRAZolam 0.5 MG tablet  Commonly known as:  XANAX  Take 0.5 tablets (0.25 mg total) by mouth 3 (three) times daily.     fluocinonide-emollient 0.05 % cream  Commonly known as:  LIDEX-E  Apply topically 2 (two) times daily.     predniSONE 20 MG tablet  Commonly known as:  DELTASONE  Take 2 tablets (40 mg total) by mouth daily with breakfast.     traZODone 50 MG tablet  Commonly known as:  DESYREL  Take 100 mg by mouth at bedtime.       Allergies  Allergen  Reactions  . Penicillins Other (See Comments)    Severe constipation / stomach cramps  . Sulfur Other (See Comments)    Severe stomach pain  . Levofloxacin Rash       Follow-up Information    Follow up with Marjorie Smolder, MD In 1 week.   Specialty:  Family Medicine   Contact information:   [address] [place] [address] [phone]        The results of significant diagnostics from this hospitalization (including imaging, microbiology, ancillary and laboratory) are listed below for reference.    Significant Diagnostic Studies: Dg Chest 2 View  02/10/2014   CLINICAL DATA:  Former smoker, altered mental status for 10 days since being treated for a UTI  EXAM: CHEST  2 VIEW  COMPARISON:  None  FINDINGS: Upper normal heart size.  Calcified tortuous aorta.  Mediastinal contours and pulmonary vascularity normal.  Emphysematous changes consistent with COPD.  No acute infiltrate, pleural effusion or pneumothorax.  Bones demineralized with thoracolumbar scoliosis.  IMPRESSION: COPD changes.  No acute abnormalities.   Electronically Signed   By: Lavonia Dana M.D.   On: 02/10/2014 16:17   Ct Head Wo Contrast  01/28/2014   CLINICAL DATA:  Found on the floor this morning by staff. Family states she hasn't been herself since Friday, slurred speech and difficulty walking. Pt denies pain upon arrival, she is alert, but confused  EXAM: CT HEAD WITHOUT CONTRAST  TECHNIQUE: Contiguous axial images were obtained from the base of the skull through the vertex without intravenous contrast.  COMPARISON:  06/27/2003  FINDINGS: There is no evidence of mass effect, midline shift, or extra-axial fluid collections. There is no evidence of a space-occupying lesion or intracranial hemorrhage. There is no evidence of a cortical-based area of acute infarction. There is generalized cerebral atrophy. There is periventricular white matter low attenuation likely secondary to microangiopathy.  The  ventricles and sulci are appropriate for the patient's age. The basal cisterns are patent.  The right globe is intact. The left globe is irregular in contour and smaller within the right lobe with peripheral calcification. There are small air-fluid levels in bilateral maxillary sinuses.  The osseous structures are unremarkable.  IMPRESSION: No acute intracranial pathology.   Electronically Signed   By: Kathreen Devoid   On: 01/28/2014 17:11   Mr Brain Wo Contrast  02/11/2014   CLINICAL DATA:  80 year old female with altered mental status, progressively worse over the past 3 - 4 weeks. Hallucinations. Chronic benzodiazepine use. Subsequent encounter.  EXAM: MRI HEAD WITHOUT CONTRAST  TECHNIQUE: Multiplanar, multiecho pulse sequences of the brain and surrounding structures were obtained without intravenous contrast.  COMPARISON:  01/28/2014 CT.  No comparison MR.  FINDINGS: Exam is motion degraded.  2 cm region of altered signal intensity anterior inferior left frontal lobe. My suspicion  is that this may represent a meningioma arising from the left orbital roof. Contrast-enhanced MR with attention to this region would prove helpful. Mild displacement of surrounding brain parenchyma without significant vasogenic edema.  No acute infarct.  No intracranial hemorrhage.  Moderate to marked small vessel disease type changes.  Major intracranial vascular structures are patent and ectatic.  Global atrophy without hydrocephalus.  Banding left globe with distorted configuration of the left globe.  Mild cervical spondylotic changes. Cervical medullary junction, pituitary region and pineal region unremarkable.  IMPRESSION: Exam is motion degraded.  2 cm meningioma arising from the left orbital roof suspected as noted above. Contrast-enhanced MR with attention to this region would prove helpful. Mild displacement of surrounding brain parenchyma without significant vasogenic edema.  No acute infarct.  No intracranial hemorrhage.   Moderate to marked small vessel disease type changes.  Global atrophy without hydrocephalus.  Banding left globe with distorted configuration of the left globe.   Electronically Signed   By: Chauncey Cruel M.D.   On: 02/11/2014 13:31   Ct Abdomen Pelvis W Contrast  01/29/2014   EXAM: CT ABDOMEN AND PELVIS WITH CONTRAST  TECHNIQUE: Multidetector CT imaging of the abdomen and pelvis was performed using the standard protocol following bolus administration of intravenous contrast.  CONTRAST:  176mL OMNIPAQUE IOHEXOL 300 MG/ML IV. Oral contrast was also administered.  COMPARISON:  None.  FINDINGS: Mild intra and extrahepatic biliary ductal dilation, the common duct measuring up to approximately 8 mm. The duct can be followed to the ampulla where there is intraluminal material. No calcified gallstones within the gallbladder, though there may be gallbladder sludge. No evidence for acute cholecystitis.  Numerous calcified granulomata throughout the liver. Multiple hepatic cysts, the largest approximating 2 cm in the caudate lobe; no significant focal hepatic parenchymal abnormalities. Multiple calcified granulomata throughout the otherwise normal-appearing spleen. Pancreas mildly atrophic, accounting for the borderline pancreatic ductal dilation. Normal adrenal glands and kidneys. Severe aortoiliofemoral atherosclerosis without aneurysm. Stenosis at the origin of the IMA. Atherosclerotic though patent origins of the SMA, celiac and both renal arteries. No significant lymphadenopathy.  Stomach completely decompressed and unremarkable. Normal-appearing small bowel. Tortuous and redundant colon with severe diverticulosis involving the descending and sigmoid colon without evidence of acute diverticulitis. Appendix not clearly visualized, but no pericecal inflammation. No ascites.  Urinary bladder unremarkable. Uterus atrophic consistent with age containing submucosal calcifications which are likely dystrophic. Endometrial  thickening, more so than expected for age. No adnexal masses. Numerous pelvic phleboliths. No free pelvic fluid.  Bone window images demonstrate lumbar scoliosis convex right, severe diffuse degenerative disc disease, spondylosis and facet degenerative changes throughout the lower thoracic and lumbar spine, degenerative grade 1 spondylolisthesis of L5 on S1 approximating 9 mm, osseous demineralization, degenerative changes in the sacroiliac joints, and moderate degenerative changes in both hips. Scarring involving the visualized lower lobes, with calcified pleural plaque along the right hemidiaphragm. Heart enlarged with aortic annular, mitral annular and aortic valvular calcification. Visualized ascending thoracic aorta dilated up to approximately 4.5 cm diameter.  IMPRESSION: 1. Mild intra and extrahepatic biliary ductal dilation with material in the distal common bile duct that is likely sludge, though a small ampullary tumor can have a similar appearance on CT. ERCP or MRCP may be helpful in further evaluation. 2. Atrophic uterus with endometrial thickening that is more prominent than expected for patient of this age. Is there dysfunctional uterine bleeding? If further imaging evaluation is felt necessary, pelvic ultrasound may be helpful. 3. Extensive diverticulosis  involving the descending and sigmoid colon without evidence of acute diverticulitis. 4. Gallbladder sludge without evidence of cholelithiasis or cholecystitis. 5. Multiple hepatic cysts. Calcified granulomata involving the liver and spleen. 6. Pancreatic atrophy. 7. Dilation of the visualized ascending thoracic aorta up to approximately 4.5 cm diameter. This may be secondary to aortic stenosis, as there are is moderate aortic valvular calcification. Nonemergent CTA of the chest may be helpful for complete evaluation of the thoracic aorta. 8. Osseous findings as above.   Electronically Signed   By: Evangeline Dakin M.D.   On: 01/29/2014 13:12     Microbiology: Recent Results (from the past 240 hour(s))  MRSA PCR Screening     Status: None   Collection Time: 02/10/14  8:33 PM  Result Value Ref Range Status   MRSA by PCR NEGATIVE NEGATIVE Final    Comment:        The GeneXpert MRSA Assay (FDA approved for NASAL specimens only), is one component of a comprehensive MRSA colonization surveillance program. It is not intended to diagnose MRSA infection nor to guide or monitor treatment for MRSA infections.      Labs: Basic Metabolic Panel:  Recent Labs Lab 02/10/14 1229 02/11/14 0615  NA 136* 137  K 3.9 3.9  CL 97 105  CO2 23 23  GLUCOSE 166* 98  BUN 25* 15  CREATININE 1.01 0.71  CALCIUM 9.8 8.8   Liver Function Tests:  Recent Labs Lab 02/10/14 1229 02/11/14 0615  AST 22 26  ALT 14 15  ALKPHOS 70 56  BILITOT 0.6 0.8  PROT 7.1 5.9*  ALBUMIN 4.1 3.7   No results for input(s): LIPASE, AMYLASE in the last 168 hours.  Recent Labs Lab 02/10/14 1229  AMMONIA 25   CBC:  Recent Labs Lab 02/10/14 1229 02/11/14 0615  WBC 7.8 6.4  NEUTROABS 5.6 4.2  HGB 13.2 13.0  HCT 39.3 38.8  MCV 96.6 96.5  PLT 161 156   Cardiac Enzymes: No results for input(s): CKTOTAL, CKMB, CKMBINDEX, TROPONINI in the last 168 hours. BNP: BNP (last 3 results) No results for input(s): PROBNP in the last 8760 hours. CBG: No results for input(s): GLUCAP in the last 168 hours.     SignedKinnie Feil  [facility] 02/13/2014, 12:00 PM

## 2014-02-13 NOTE — Clinical Social Work Psychosocial (Signed)
Clinical Social Work Department BRIEF PSYCHOSOCIAL ASSESSMENT 02/13/2014  Patient:  ELIZZIE, WESTERGARD     Account Number:  [id number]     Admit date:  02/10/2014  Clinical Social Worker:  Lovey Newcomer  Date/Time:  02/13/2014 11:59 AM  Referred by:  Physician  Date Referred:  02/13/2014 Referred for  SNF Placement   Other Referral:   NA   Interview type:  Patient Other interview type:   Patient alert and oriented at time of assessment.    PSYCHOSOCIAL DATA Living Status:  ALONE Admitted from facility:  Other Level of care:  Independent Living Primary support name:  [name] Primary support relationship to patient:  CHILD, ADULT Degree of support available:   Support is strong.    CURRENT CONCERNS Current Concerns  Post-Acute Placement   Other Concerns:   NA    SOCIAL WORK ASSESSMENT / PLAN CSW met with family at bedside to complete assessment. Patient was admitted from [facility] but family feels she will benefit from SNF placement at discharge before returning to [facility]. CSW explained SNF search/placement process and answered questions. Family appears very supportive but somewhat overwhelmed by the need for a quick transition to SNF today.   Assessment/plan status:  Psychosocial Support/Ongoing Assessment of Needs Other assessment/ plan:   Complete Fl2, Fax, PASRR   Information/referral to community resources:   CSW contact information and SNF list given.    PATIENT'S/FAMILY'S RESPONSE TO PLAN OF CARE: Patient's family plans for the patient to DC to SNF when medically stable. CSW will assist.       Liz Beach MSW, [place], [place], [phone]

## 2014-02-13 NOTE — Care Management Note (Signed)
    Page 1 of 1   02/13/2014     10:54:08 AM CARE MANAGEMENT NOTE 02/13/2014  Patient:  JAZMYNN, PHO   Account Number:  [id number]  Date Initiated:  02/13/2014  Documentation initiated by:  Tomi Bamberger  Subjective/Objective Assessment:   dx acut encephalopathy, skin rash, uti  admit- from [facility].     Action/Plan:   pt eval-= rec snf.   Anticipated DC Date:  02/13/2014   Anticipated DC Plan:  SKILLED NURSING FACILITY  In-house referral  Clinical Social Worker      DC Planning Services  CM consult      Choice offered to / List presented to:             Status of service:  Completed, signed off Medicare Important Message given?  YES (If response is "NO", the following Medicare IM given date fields will be blank) Date Medicare IM given:  02/13/2014 Medicare IM given by:  Tomi Bamberger Date Additional Medicare IM given:   Additional Medicare IM given by:    Discharge Disposition:  [facility]  Per UR Regulation:  Reviewed for med. necessity/level of care/duration of stay  If discussed at [place] of Stay Meetings, dates discussed:    Comments:  02/13/14 [place], BSN [phone] pt for dc to snf today, CSW following.

## 2014-03-24 DIAGNOSIS — M19042 Primary osteoarthritis, left hand: Secondary | ICD-10-CM | POA: Diagnosis not present

## 2014-03-24 DIAGNOSIS — F419 Anxiety disorder, unspecified: Secondary | ICD-10-CM | POA: Diagnosis not present

## 2014-03-24 DIAGNOSIS — K589 Irritable bowel syndrome without diarrhea: Secondary | ICD-10-CM | POA: Diagnosis not present

## 2014-03-24 DIAGNOSIS — R2689 Other abnormalities of gait and mobility: Secondary | ICD-10-CM | POA: Diagnosis not present

## 2014-03-24 DIAGNOSIS — F329 Major depressive disorder, single episode, unspecified: Secondary | ICD-10-CM | POA: Diagnosis not present

## 2014-03-24 DIAGNOSIS — M810 Age-related osteoporosis without current pathological fracture: Secondary | ICD-10-CM | POA: Diagnosis not present

## 2014-03-24 DIAGNOSIS — M19041 Primary osteoarthritis, right hand: Secondary | ICD-10-CM | POA: Diagnosis not present

## 2014-03-25 DIAGNOSIS — M19042 Primary osteoarthritis, left hand: Secondary | ICD-10-CM | POA: Diagnosis not present

## 2014-03-25 DIAGNOSIS — K589 Irritable bowel syndrome without diarrhea: Secondary | ICD-10-CM | POA: Diagnosis not present

## 2014-03-25 DIAGNOSIS — R2689 Other abnormalities of gait and mobility: Secondary | ICD-10-CM | POA: Diagnosis not present

## 2014-03-25 DIAGNOSIS — M810 Age-related osteoporosis without current pathological fracture: Secondary | ICD-10-CM | POA: Diagnosis not present

## 2014-03-25 DIAGNOSIS — F329 Major depressive disorder, single episode, unspecified: Secondary | ICD-10-CM | POA: Diagnosis not present

## 2014-03-25 DIAGNOSIS — M19041 Primary osteoarthritis, right hand: Secondary | ICD-10-CM | POA: Diagnosis not present

## 2014-03-25 DIAGNOSIS — F419 Anxiety disorder, unspecified: Secondary | ICD-10-CM | POA: Diagnosis not present

## 2014-03-27 DIAGNOSIS — L905 Scar conditions and fibrosis of skin: Secondary | ICD-10-CM | POA: Diagnosis not present

## 2014-03-27 DIAGNOSIS — C44519 Basal cell carcinoma of skin of other part of trunk: Secondary | ICD-10-CM | POA: Diagnosis not present

## 2014-03-27 DIAGNOSIS — C44622 Squamous cell carcinoma of skin of right upper limb, including shoulder: Secondary | ICD-10-CM | POA: Diagnosis not present

## 2014-03-28 DIAGNOSIS — F329 Major depressive disorder, single episode, unspecified: Secondary | ICD-10-CM | POA: Diagnosis not present

## 2014-03-28 DIAGNOSIS — R2689 Other abnormalities of gait and mobility: Secondary | ICD-10-CM | POA: Diagnosis not present

## 2014-03-28 DIAGNOSIS — M19042 Primary osteoarthritis, left hand: Secondary | ICD-10-CM | POA: Diagnosis not present

## 2014-03-28 DIAGNOSIS — M19041 Primary osteoarthritis, right hand: Secondary | ICD-10-CM | POA: Diagnosis not present

## 2014-03-28 DIAGNOSIS — F419 Anxiety disorder, unspecified: Secondary | ICD-10-CM | POA: Diagnosis not present

## 2014-03-28 DIAGNOSIS — M810 Age-related osteoporosis without current pathological fracture: Secondary | ICD-10-CM | POA: Diagnosis not present

## 2014-03-28 DIAGNOSIS — K589 Irritable bowel syndrome without diarrhea: Secondary | ICD-10-CM | POA: Diagnosis not present

## 2014-03-29 DIAGNOSIS — F329 Major depressive disorder, single episode, unspecified: Secondary | ICD-10-CM | POA: Diagnosis not present

## 2014-03-29 DIAGNOSIS — M19041 Primary osteoarthritis, right hand: Secondary | ICD-10-CM | POA: Diagnosis not present

## 2014-03-29 DIAGNOSIS — M810 Age-related osteoporosis without current pathological fracture: Secondary | ICD-10-CM | POA: Diagnosis not present

## 2014-03-29 DIAGNOSIS — F419 Anxiety disorder, unspecified: Secondary | ICD-10-CM | POA: Diagnosis not present

## 2014-03-29 DIAGNOSIS — K589 Irritable bowel syndrome without diarrhea: Secondary | ICD-10-CM | POA: Diagnosis not present

## 2014-03-29 DIAGNOSIS — M19042 Primary osteoarthritis, left hand: Secondary | ICD-10-CM | POA: Diagnosis not present

## 2014-03-29 DIAGNOSIS — R2689 Other abnormalities of gait and mobility: Secondary | ICD-10-CM | POA: Diagnosis not present

## 2014-03-31 DIAGNOSIS — M19042 Primary osteoarthritis, left hand: Secondary | ICD-10-CM | POA: Diagnosis not present

## 2014-03-31 DIAGNOSIS — K589 Irritable bowel syndrome without diarrhea: Secondary | ICD-10-CM | POA: Diagnosis not present

## 2014-03-31 DIAGNOSIS — F419 Anxiety disorder, unspecified: Secondary | ICD-10-CM | POA: Diagnosis not present

## 2014-03-31 DIAGNOSIS — R2689 Other abnormalities of gait and mobility: Secondary | ICD-10-CM | POA: Diagnosis not present

## 2014-03-31 DIAGNOSIS — M19041 Primary osteoarthritis, right hand: Secondary | ICD-10-CM | POA: Diagnosis not present

## 2014-03-31 DIAGNOSIS — M810 Age-related osteoporosis without current pathological fracture: Secondary | ICD-10-CM | POA: Diagnosis not present

## 2014-03-31 DIAGNOSIS — F329 Major depressive disorder, single episode, unspecified: Secondary | ICD-10-CM | POA: Diagnosis not present

## 2014-04-03 DIAGNOSIS — M19042 Primary osteoarthritis, left hand: Secondary | ICD-10-CM | POA: Diagnosis not present

## 2014-04-03 DIAGNOSIS — R2689 Other abnormalities of gait and mobility: Secondary | ICD-10-CM | POA: Diagnosis not present

## 2014-04-03 DIAGNOSIS — M19041 Primary osteoarthritis, right hand: Secondary | ICD-10-CM | POA: Diagnosis not present

## 2014-04-03 DIAGNOSIS — K589 Irritable bowel syndrome without diarrhea: Secondary | ICD-10-CM | POA: Diagnosis not present

## 2014-04-03 DIAGNOSIS — F329 Major depressive disorder, single episode, unspecified: Secondary | ICD-10-CM | POA: Diagnosis not present

## 2014-04-03 DIAGNOSIS — M810 Age-related osteoporosis without current pathological fracture: Secondary | ICD-10-CM | POA: Diagnosis not present

## 2014-04-03 DIAGNOSIS — F419 Anxiety disorder, unspecified: Secondary | ICD-10-CM | POA: Diagnosis not present

## 2014-04-09 DIAGNOSIS — M19041 Primary osteoarthritis, right hand: Secondary | ICD-10-CM | POA: Diagnosis not present

## 2014-04-09 DIAGNOSIS — M19042 Primary osteoarthritis, left hand: Secondary | ICD-10-CM | POA: Diagnosis not present

## 2014-04-09 DIAGNOSIS — F419 Anxiety disorder, unspecified: Secondary | ICD-10-CM | POA: Diagnosis not present

## 2014-04-09 DIAGNOSIS — F329 Major depressive disorder, single episode, unspecified: Secondary | ICD-10-CM | POA: Diagnosis not present

## 2014-04-09 DIAGNOSIS — M810 Age-related osteoporosis without current pathological fracture: Secondary | ICD-10-CM | POA: Diagnosis not present

## 2014-04-09 DIAGNOSIS — K589 Irritable bowel syndrome without diarrhea: Secondary | ICD-10-CM | POA: Diagnosis not present

## 2014-04-09 DIAGNOSIS — R2689 Other abnormalities of gait and mobility: Secondary | ICD-10-CM | POA: Diagnosis not present

## 2014-04-11 DIAGNOSIS — M19042 Primary osteoarthritis, left hand: Secondary | ICD-10-CM | POA: Diagnosis not present

## 2014-04-11 DIAGNOSIS — M19041 Primary osteoarthritis, right hand: Secondary | ICD-10-CM | POA: Diagnosis not present

## 2014-04-11 DIAGNOSIS — R2689 Other abnormalities of gait and mobility: Secondary | ICD-10-CM | POA: Diagnosis not present

## 2014-04-11 DIAGNOSIS — F329 Major depressive disorder, single episode, unspecified: Secondary | ICD-10-CM | POA: Diagnosis not present

## 2014-04-11 DIAGNOSIS — F419 Anxiety disorder, unspecified: Secondary | ICD-10-CM | POA: Diagnosis not present

## 2014-04-11 DIAGNOSIS — K589 Irritable bowel syndrome without diarrhea: Secondary | ICD-10-CM | POA: Diagnosis not present

## 2014-04-11 DIAGNOSIS — M810 Age-related osteoporosis without current pathological fracture: Secondary | ICD-10-CM | POA: Diagnosis not present

## 2014-04-14 DIAGNOSIS — M810 Age-related osteoporosis without current pathological fracture: Secondary | ICD-10-CM | POA: Diagnosis not present

## 2014-04-14 DIAGNOSIS — R2689 Other abnormalities of gait and mobility: Secondary | ICD-10-CM | POA: Diagnosis not present

## 2014-04-14 DIAGNOSIS — K589 Irritable bowel syndrome without diarrhea: Secondary | ICD-10-CM | POA: Diagnosis not present

## 2014-04-14 DIAGNOSIS — M19042 Primary osteoarthritis, left hand: Secondary | ICD-10-CM | POA: Diagnosis not present

## 2014-04-14 DIAGNOSIS — F419 Anxiety disorder, unspecified: Secondary | ICD-10-CM | POA: Diagnosis not present

## 2014-04-14 DIAGNOSIS — M19041 Primary osteoarthritis, right hand: Secondary | ICD-10-CM | POA: Diagnosis not present

## 2014-04-14 DIAGNOSIS — F329 Major depressive disorder, single episode, unspecified: Secondary | ICD-10-CM | POA: Diagnosis not present

## 2014-04-18 DIAGNOSIS — M25473 Effusion, unspecified ankle: Secondary | ICD-10-CM | POA: Diagnosis not present

## 2014-04-21 DIAGNOSIS — M19042 Primary osteoarthritis, left hand: Secondary | ICD-10-CM | POA: Diagnosis not present

## 2014-04-21 DIAGNOSIS — F329 Major depressive disorder, single episode, unspecified: Secondary | ICD-10-CM | POA: Diagnosis not present

## 2014-04-21 DIAGNOSIS — M810 Age-related osteoporosis without current pathological fracture: Secondary | ICD-10-CM | POA: Diagnosis not present

## 2014-04-21 DIAGNOSIS — F419 Anxiety disorder, unspecified: Secondary | ICD-10-CM | POA: Diagnosis not present

## 2014-04-21 DIAGNOSIS — R2689 Other abnormalities of gait and mobility: Secondary | ICD-10-CM | POA: Diagnosis not present

## 2014-04-21 DIAGNOSIS — M19041 Primary osteoarthritis, right hand: Secondary | ICD-10-CM | POA: Diagnosis not present

## 2014-04-21 DIAGNOSIS — K589 Irritable bowel syndrome without diarrhea: Secondary | ICD-10-CM | POA: Diagnosis not present

## 2014-04-23 DIAGNOSIS — M19042 Primary osteoarthritis, left hand: Secondary | ICD-10-CM | POA: Diagnosis not present

## 2014-04-23 DIAGNOSIS — F329 Major depressive disorder, single episode, unspecified: Secondary | ICD-10-CM | POA: Diagnosis not present

## 2014-04-23 DIAGNOSIS — K589 Irritable bowel syndrome without diarrhea: Secondary | ICD-10-CM | POA: Diagnosis not present

## 2014-04-23 DIAGNOSIS — M810 Age-related osteoporosis without current pathological fracture: Secondary | ICD-10-CM | POA: Diagnosis not present

## 2014-04-23 DIAGNOSIS — F419 Anxiety disorder, unspecified: Secondary | ICD-10-CM | POA: Diagnosis not present

## 2014-04-23 DIAGNOSIS — R2689 Other abnormalities of gait and mobility: Secondary | ICD-10-CM | POA: Diagnosis not present

## 2014-04-23 DIAGNOSIS — M19041 Primary osteoarthritis, right hand: Secondary | ICD-10-CM | POA: Diagnosis not present

## 2014-04-27 DIAGNOSIS — M19042 Primary osteoarthritis, left hand: Secondary | ICD-10-CM | POA: Diagnosis not present

## 2014-04-27 DIAGNOSIS — M19041 Primary osteoarthritis, right hand: Secondary | ICD-10-CM | POA: Diagnosis not present

## 2014-04-27 DIAGNOSIS — F419 Anxiety disorder, unspecified: Secondary | ICD-10-CM | POA: Diagnosis not present

## 2014-04-27 DIAGNOSIS — R2689 Other abnormalities of gait and mobility: Secondary | ICD-10-CM | POA: Diagnosis not present

## 2014-04-28 DIAGNOSIS — R2689 Other abnormalities of gait and mobility: Secondary | ICD-10-CM | POA: Diagnosis not present

## 2014-04-28 DIAGNOSIS — M810 Age-related osteoporosis without current pathological fracture: Secondary | ICD-10-CM | POA: Diagnosis not present

## 2014-04-28 DIAGNOSIS — M19042 Primary osteoarthritis, left hand: Secondary | ICD-10-CM | POA: Diagnosis not present

## 2014-04-28 DIAGNOSIS — F419 Anxiety disorder, unspecified: Secondary | ICD-10-CM | POA: Diagnosis not present

## 2014-04-28 DIAGNOSIS — M19041 Primary osteoarthritis, right hand: Secondary | ICD-10-CM | POA: Diagnosis not present

## 2014-04-28 DIAGNOSIS — F329 Major depressive disorder, single episode, unspecified: Secondary | ICD-10-CM | POA: Diagnosis not present

## 2014-04-30 DIAGNOSIS — M19042 Primary osteoarthritis, left hand: Secondary | ICD-10-CM | POA: Diagnosis not present

## 2014-04-30 DIAGNOSIS — M19041 Primary osteoarthritis, right hand: Secondary | ICD-10-CM | POA: Diagnosis not present

## 2014-04-30 DIAGNOSIS — F329 Major depressive disorder, single episode, unspecified: Secondary | ICD-10-CM | POA: Diagnosis not present

## 2014-04-30 DIAGNOSIS — M810 Age-related osteoporosis without current pathological fracture: Secondary | ICD-10-CM | POA: Diagnosis not present

## 2014-04-30 DIAGNOSIS — F419 Anxiety disorder, unspecified: Secondary | ICD-10-CM | POA: Diagnosis not present

## 2014-04-30 DIAGNOSIS — R2689 Other abnormalities of gait and mobility: Secondary | ICD-10-CM | POA: Diagnosis not present

## 2014-05-05 DIAGNOSIS — R2689 Other abnormalities of gait and mobility: Secondary | ICD-10-CM | POA: Diagnosis not present

## 2014-05-05 DIAGNOSIS — M19041 Primary osteoarthritis, right hand: Secondary | ICD-10-CM | POA: Diagnosis not present

## 2014-05-05 DIAGNOSIS — M810 Age-related osteoporosis without current pathological fracture: Secondary | ICD-10-CM | POA: Diagnosis not present

## 2014-05-05 DIAGNOSIS — F419 Anxiety disorder, unspecified: Secondary | ICD-10-CM | POA: Diagnosis not present

## 2014-05-05 DIAGNOSIS — M19042 Primary osteoarthritis, left hand: Secondary | ICD-10-CM | POA: Diagnosis not present

## 2014-05-05 DIAGNOSIS — F329 Major depressive disorder, single episode, unspecified: Secondary | ICD-10-CM | POA: Diagnosis not present

## 2014-05-07 DIAGNOSIS — F419 Anxiety disorder, unspecified: Secondary | ICD-10-CM | POA: Diagnosis not present

## 2014-05-07 DIAGNOSIS — M810 Age-related osteoporosis without current pathological fracture: Secondary | ICD-10-CM | POA: Diagnosis not present

## 2014-05-07 DIAGNOSIS — M19041 Primary osteoarthritis, right hand: Secondary | ICD-10-CM | POA: Diagnosis not present

## 2014-05-07 DIAGNOSIS — F329 Major depressive disorder, single episode, unspecified: Secondary | ICD-10-CM | POA: Diagnosis not present

## 2014-05-07 DIAGNOSIS — M19042 Primary osteoarthritis, left hand: Secondary | ICD-10-CM | POA: Diagnosis not present

## 2014-05-07 DIAGNOSIS — R2689 Other abnormalities of gait and mobility: Secondary | ICD-10-CM | POA: Diagnosis not present

## 2014-05-12 DIAGNOSIS — R2689 Other abnormalities of gait and mobility: Secondary | ICD-10-CM | POA: Diagnosis not present

## 2014-05-12 DIAGNOSIS — M19041 Primary osteoarthritis, right hand: Secondary | ICD-10-CM | POA: Diagnosis not present

## 2014-05-12 DIAGNOSIS — M810 Age-related osteoporosis without current pathological fracture: Secondary | ICD-10-CM | POA: Diagnosis not present

## 2014-05-12 DIAGNOSIS — F329 Major depressive disorder, single episode, unspecified: Secondary | ICD-10-CM | POA: Diagnosis not present

## 2014-05-12 DIAGNOSIS — M19042 Primary osteoarthritis, left hand: Secondary | ICD-10-CM | POA: Diagnosis not present

## 2014-05-12 DIAGNOSIS — F419 Anxiety disorder, unspecified: Secondary | ICD-10-CM | POA: Diagnosis not present

## 2014-05-14 DIAGNOSIS — R2689 Other abnormalities of gait and mobility: Secondary | ICD-10-CM | POA: Diagnosis not present

## 2014-05-14 DIAGNOSIS — F419 Anxiety disorder, unspecified: Secondary | ICD-10-CM | POA: Diagnosis not present

## 2014-05-14 DIAGNOSIS — M19042 Primary osteoarthritis, left hand: Secondary | ICD-10-CM | POA: Diagnosis not present

## 2014-05-14 DIAGNOSIS — M810 Age-related osteoporosis without current pathological fracture: Secondary | ICD-10-CM | POA: Diagnosis not present

## 2014-05-14 DIAGNOSIS — F329 Major depressive disorder, single episode, unspecified: Secondary | ICD-10-CM | POA: Diagnosis not present

## 2014-05-14 DIAGNOSIS — M19041 Primary osteoarthritis, right hand: Secondary | ICD-10-CM | POA: Diagnosis not present

## 2014-05-19 DIAGNOSIS — F329 Major depressive disorder, single episode, unspecified: Secondary | ICD-10-CM | POA: Diagnosis not present

## 2014-05-19 DIAGNOSIS — R2689 Other abnormalities of gait and mobility: Secondary | ICD-10-CM | POA: Diagnosis not present

## 2014-05-19 DIAGNOSIS — M19042 Primary osteoarthritis, left hand: Secondary | ICD-10-CM | POA: Diagnosis not present

## 2014-05-19 DIAGNOSIS — M19041 Primary osteoarthritis, right hand: Secondary | ICD-10-CM | POA: Diagnosis not present

## 2014-05-19 DIAGNOSIS — M810 Age-related osteoporosis without current pathological fracture: Secondary | ICD-10-CM | POA: Diagnosis not present

## 2014-05-19 DIAGNOSIS — F419 Anxiety disorder, unspecified: Secondary | ICD-10-CM | POA: Diagnosis not present

## 2014-05-21 DIAGNOSIS — R2689 Other abnormalities of gait and mobility: Secondary | ICD-10-CM | POA: Diagnosis not present

## 2014-05-21 DIAGNOSIS — M19041 Primary osteoarthritis, right hand: Secondary | ICD-10-CM | POA: Diagnosis not present

## 2014-05-21 DIAGNOSIS — M810 Age-related osteoporosis without current pathological fracture: Secondary | ICD-10-CM | POA: Diagnosis not present

## 2014-05-21 DIAGNOSIS — M19042 Primary osteoarthritis, left hand: Secondary | ICD-10-CM | POA: Diagnosis not present

## 2014-05-21 DIAGNOSIS — F329 Major depressive disorder, single episode, unspecified: Secondary | ICD-10-CM | POA: Diagnosis not present

## 2014-05-21 DIAGNOSIS — F419 Anxiety disorder, unspecified: Secondary | ICD-10-CM | POA: Diagnosis not present

## 2014-05-26 DIAGNOSIS — M810 Age-related osteoporosis without current pathological fracture: Secondary | ICD-10-CM | POA: Diagnosis not present

## 2014-05-26 DIAGNOSIS — F329 Major depressive disorder, single episode, unspecified: Secondary | ICD-10-CM | POA: Diagnosis not present

## 2014-05-26 DIAGNOSIS — R2689 Other abnormalities of gait and mobility: Secondary | ICD-10-CM | POA: Diagnosis not present

## 2014-05-26 DIAGNOSIS — M19042 Primary osteoarthritis, left hand: Secondary | ICD-10-CM | POA: Diagnosis not present

## 2014-05-26 DIAGNOSIS — F419 Anxiety disorder, unspecified: Secondary | ICD-10-CM | POA: Diagnosis not present

## 2014-05-26 DIAGNOSIS — M19041 Primary osteoarthritis, right hand: Secondary | ICD-10-CM | POA: Diagnosis not present

## 2014-05-29 DIAGNOSIS — F329 Major depressive disorder, single episode, unspecified: Secondary | ICD-10-CM | POA: Diagnosis not present

## 2014-05-29 DIAGNOSIS — M19041 Primary osteoarthritis, right hand: Secondary | ICD-10-CM | POA: Diagnosis not present

## 2014-05-29 DIAGNOSIS — R2689 Other abnormalities of gait and mobility: Secondary | ICD-10-CM | POA: Diagnosis not present

## 2014-05-29 DIAGNOSIS — M810 Age-related osteoporosis without current pathological fracture: Secondary | ICD-10-CM | POA: Diagnosis not present

## 2014-05-29 DIAGNOSIS — M19042 Primary osteoarthritis, left hand: Secondary | ICD-10-CM | POA: Diagnosis not present

## 2014-05-29 DIAGNOSIS — F419 Anxiety disorder, unspecified: Secondary | ICD-10-CM | POA: Diagnosis not present

## 2014-06-03 DIAGNOSIS — M19041 Primary osteoarthritis, right hand: Secondary | ICD-10-CM | POA: Diagnosis not present

## 2014-06-03 DIAGNOSIS — R2689 Other abnormalities of gait and mobility: Secondary | ICD-10-CM | POA: Diagnosis not present

## 2014-06-03 DIAGNOSIS — M19042 Primary osteoarthritis, left hand: Secondary | ICD-10-CM | POA: Diagnosis not present

## 2014-06-03 DIAGNOSIS — F419 Anxiety disorder, unspecified: Secondary | ICD-10-CM | POA: Diagnosis not present

## 2014-06-03 DIAGNOSIS — M810 Age-related osteoporosis without current pathological fracture: Secondary | ICD-10-CM | POA: Diagnosis not present

## 2014-06-03 DIAGNOSIS — F329 Major depressive disorder, single episode, unspecified: Secondary | ICD-10-CM | POA: Diagnosis not present

## 2014-06-06 DIAGNOSIS — M19042 Primary osteoarthritis, left hand: Secondary | ICD-10-CM | POA: Diagnosis not present

## 2014-06-06 DIAGNOSIS — F329 Major depressive disorder, single episode, unspecified: Secondary | ICD-10-CM | POA: Diagnosis not present

## 2014-06-06 DIAGNOSIS — M810 Age-related osteoporosis without current pathological fracture: Secondary | ICD-10-CM | POA: Diagnosis not present

## 2014-06-06 DIAGNOSIS — M19041 Primary osteoarthritis, right hand: Secondary | ICD-10-CM | POA: Diagnosis not present

## 2014-06-06 DIAGNOSIS — R2689 Other abnormalities of gait and mobility: Secondary | ICD-10-CM | POA: Diagnosis not present

## 2014-06-06 DIAGNOSIS — F419 Anxiety disorder, unspecified: Secondary | ICD-10-CM | POA: Diagnosis not present

## 2014-06-10 DIAGNOSIS — F419 Anxiety disorder, unspecified: Secondary | ICD-10-CM | POA: Diagnosis not present

## 2014-06-10 DIAGNOSIS — M19041 Primary osteoarthritis, right hand: Secondary | ICD-10-CM | POA: Diagnosis not present

## 2014-06-10 DIAGNOSIS — M810 Age-related osteoporosis without current pathological fracture: Secondary | ICD-10-CM | POA: Diagnosis not present

## 2014-06-10 DIAGNOSIS — F329 Major depressive disorder, single episode, unspecified: Secondary | ICD-10-CM | POA: Diagnosis not present

## 2014-06-10 DIAGNOSIS — M19042 Primary osteoarthritis, left hand: Secondary | ICD-10-CM | POA: Diagnosis not present

## 2014-06-10 DIAGNOSIS — R2689 Other abnormalities of gait and mobility: Secondary | ICD-10-CM | POA: Diagnosis not present

## 2014-06-12 DIAGNOSIS — M19041 Primary osteoarthritis, right hand: Secondary | ICD-10-CM | POA: Diagnosis not present

## 2014-06-12 DIAGNOSIS — F419 Anxiety disorder, unspecified: Secondary | ICD-10-CM | POA: Diagnosis not present

## 2014-06-12 DIAGNOSIS — R2689 Other abnormalities of gait and mobility: Secondary | ICD-10-CM | POA: Diagnosis not present

## 2014-06-12 DIAGNOSIS — M810 Age-related osteoporosis without current pathological fracture: Secondary | ICD-10-CM | POA: Diagnosis not present

## 2014-06-12 DIAGNOSIS — M19042 Primary osteoarthritis, left hand: Secondary | ICD-10-CM | POA: Diagnosis not present

## 2014-06-12 DIAGNOSIS — F329 Major depressive disorder, single episode, unspecified: Secondary | ICD-10-CM | POA: Diagnosis not present

## 2014-06-16 DIAGNOSIS — F419 Anxiety disorder, unspecified: Secondary | ICD-10-CM | POA: Diagnosis not present

## 2014-06-16 DIAGNOSIS — R2689 Other abnormalities of gait and mobility: Secondary | ICD-10-CM | POA: Diagnosis not present

## 2014-06-16 DIAGNOSIS — M19042 Primary osteoarthritis, left hand: Secondary | ICD-10-CM | POA: Diagnosis not present

## 2014-06-16 DIAGNOSIS — M19041 Primary osteoarthritis, right hand: Secondary | ICD-10-CM | POA: Diagnosis not present

## 2014-06-16 DIAGNOSIS — M810 Age-related osteoporosis without current pathological fracture: Secondary | ICD-10-CM | POA: Diagnosis not present

## 2014-06-16 DIAGNOSIS — F329 Major depressive disorder, single episode, unspecified: Secondary | ICD-10-CM | POA: Diagnosis not present

## 2014-06-19 DIAGNOSIS — F329 Major depressive disorder, single episode, unspecified: Secondary | ICD-10-CM | POA: Diagnosis not present

## 2014-06-19 DIAGNOSIS — M810 Age-related osteoporosis without current pathological fracture: Secondary | ICD-10-CM | POA: Diagnosis not present

## 2014-06-19 DIAGNOSIS — R2689 Other abnormalities of gait and mobility: Secondary | ICD-10-CM | POA: Diagnosis not present

## 2014-06-19 DIAGNOSIS — M19042 Primary osteoarthritis, left hand: Secondary | ICD-10-CM | POA: Diagnosis not present

## 2014-06-19 DIAGNOSIS — M19041 Primary osteoarthritis, right hand: Secondary | ICD-10-CM | POA: Diagnosis not present

## 2014-06-19 DIAGNOSIS — F419 Anxiety disorder, unspecified: Secondary | ICD-10-CM | POA: Diagnosis not present

## 2014-06-24 DIAGNOSIS — R2689 Other abnormalities of gait and mobility: Secondary | ICD-10-CM | POA: Diagnosis not present

## 2014-06-24 DIAGNOSIS — M19041 Primary osteoarthritis, right hand: Secondary | ICD-10-CM | POA: Diagnosis not present

## 2014-06-24 DIAGNOSIS — M810 Age-related osteoporosis without current pathological fracture: Secondary | ICD-10-CM | POA: Diagnosis not present

## 2014-06-24 DIAGNOSIS — Z9181 History of falling: Secondary | ICD-10-CM | POA: Diagnosis not present

## 2014-06-24 DIAGNOSIS — Z7982 Long term (current) use of aspirin: Secondary | ICD-10-CM | POA: Diagnosis not present

## 2014-06-24 DIAGNOSIS — M19042 Primary osteoarthritis, left hand: Secondary | ICD-10-CM | POA: Diagnosis not present

## 2014-06-24 DIAGNOSIS — I2510 Atherosclerotic heart disease of native coronary artery without angina pectoris: Secondary | ICD-10-CM | POA: Diagnosis not present

## 2014-06-24 DIAGNOSIS — F329 Major depressive disorder, single episode, unspecified: Secondary | ICD-10-CM | POA: Diagnosis not present

## 2014-06-24 DIAGNOSIS — F419 Anxiety disorder, unspecified: Secondary | ICD-10-CM | POA: Diagnosis not present

## 2014-06-26 DIAGNOSIS — M19041 Primary osteoarthritis, right hand: Secondary | ICD-10-CM | POA: Diagnosis not present

## 2014-06-26 DIAGNOSIS — F329 Major depressive disorder, single episode, unspecified: Secondary | ICD-10-CM | POA: Diagnosis not present

## 2014-06-26 DIAGNOSIS — R2689 Other abnormalities of gait and mobility: Secondary | ICD-10-CM | POA: Diagnosis not present

## 2014-06-26 DIAGNOSIS — M810 Age-related osteoporosis without current pathological fracture: Secondary | ICD-10-CM | POA: Diagnosis not present

## 2014-06-26 DIAGNOSIS — F419 Anxiety disorder, unspecified: Secondary | ICD-10-CM | POA: Diagnosis not present

## 2014-06-26 DIAGNOSIS — I2510 Atherosclerotic heart disease of native coronary artery without angina pectoris: Secondary | ICD-10-CM | POA: Diagnosis not present

## 2014-06-26 DIAGNOSIS — Z7982 Long term (current) use of aspirin: Secondary | ICD-10-CM | POA: Diagnosis not present

## 2014-06-26 DIAGNOSIS — Z9181 History of falling: Secondary | ICD-10-CM | POA: Diagnosis not present

## 2014-06-26 DIAGNOSIS — M19042 Primary osteoarthritis, left hand: Secondary | ICD-10-CM | POA: Diagnosis not present

## 2014-07-01 DIAGNOSIS — M19041 Primary osteoarthritis, right hand: Secondary | ICD-10-CM | POA: Diagnosis not present

## 2014-07-01 DIAGNOSIS — M810 Age-related osteoporosis without current pathological fracture: Secondary | ICD-10-CM | POA: Diagnosis not present

## 2014-07-01 DIAGNOSIS — F329 Major depressive disorder, single episode, unspecified: Secondary | ICD-10-CM | POA: Diagnosis not present

## 2014-07-01 DIAGNOSIS — I2510 Atherosclerotic heart disease of native coronary artery without angina pectoris: Secondary | ICD-10-CM | POA: Diagnosis not present

## 2014-07-01 DIAGNOSIS — R2689 Other abnormalities of gait and mobility: Secondary | ICD-10-CM | POA: Diagnosis not present

## 2014-07-01 DIAGNOSIS — Z9181 History of falling: Secondary | ICD-10-CM | POA: Diagnosis not present

## 2014-07-01 DIAGNOSIS — M19042 Primary osteoarthritis, left hand: Secondary | ICD-10-CM | POA: Diagnosis not present

## 2014-07-01 DIAGNOSIS — F419 Anxiety disorder, unspecified: Secondary | ICD-10-CM | POA: Diagnosis not present

## 2014-07-01 DIAGNOSIS — Z7982 Long term (current) use of aspirin: Secondary | ICD-10-CM | POA: Diagnosis not present

## 2014-07-10 DIAGNOSIS — G4700 Insomnia, unspecified: Secondary | ICD-10-CM | POA: Diagnosis not present

## 2014-07-10 DIAGNOSIS — H540 Blindness, both eyes: Secondary | ICD-10-CM | POA: Diagnosis not present

## 2014-07-10 DIAGNOSIS — F419 Anxiety disorder, unspecified: Secondary | ICD-10-CM | POA: Diagnosis not present

## 2014-07-10 DIAGNOSIS — E559 Vitamin D deficiency, unspecified: Secondary | ICD-10-CM | POA: Diagnosis not present

## 2014-08-08 DIAGNOSIS — D485 Neoplasm of uncertain behavior of skin: Secondary | ICD-10-CM | POA: Diagnosis not present

## 2014-08-08 DIAGNOSIS — Z85828 Personal history of other malignant neoplasm of skin: Secondary | ICD-10-CM | POA: Diagnosis not present

## 2014-08-08 DIAGNOSIS — C44529 Squamous cell carcinoma of skin of other part of trunk: Secondary | ICD-10-CM | POA: Diagnosis not present

## 2014-08-08 DIAGNOSIS — L570 Actinic keratosis: Secondary | ICD-10-CM | POA: Diagnosis not present

## 2014-08-08 DIAGNOSIS — L821 Other seborrheic keratosis: Secondary | ICD-10-CM | POA: Diagnosis not present

## 2014-10-13 DIAGNOSIS — G4700 Insomnia, unspecified: Secondary | ICD-10-CM | POA: Diagnosis not present

## 2014-10-13 DIAGNOSIS — H540 Blindness, both eyes: Secondary | ICD-10-CM | POA: Diagnosis not present

## 2014-10-13 DIAGNOSIS — R350 Frequency of micturition: Secondary | ICD-10-CM | POA: Diagnosis not present

## 2014-10-13 DIAGNOSIS — F419 Anxiety disorder, unspecified: Secondary | ICD-10-CM | POA: Diagnosis not present

## 2014-10-20 DIAGNOSIS — R197 Diarrhea, unspecified: Secondary | ICD-10-CM | POA: Diagnosis not present

## 2014-10-20 DIAGNOSIS — M62838 Other muscle spasm: Secondary | ICD-10-CM | POA: Diagnosis not present

## 2014-10-20 DIAGNOSIS — F419 Anxiety disorder, unspecified: Secondary | ICD-10-CM | POA: Diagnosis not present

## 2014-11-05 DIAGNOSIS — M810 Age-related osteoporosis without current pathological fracture: Secondary | ICD-10-CM | POA: Diagnosis not present

## 2014-11-05 DIAGNOSIS — R2689 Other abnormalities of gait and mobility: Secondary | ICD-10-CM | POA: Diagnosis not present

## 2014-11-05 DIAGNOSIS — M1990 Unspecified osteoarthritis, unspecified site: Secondary | ICD-10-CM | POA: Diagnosis not present

## 2014-11-05 DIAGNOSIS — J449 Chronic obstructive pulmonary disease, unspecified: Secondary | ICD-10-CM | POA: Diagnosis not present

## 2014-11-05 DIAGNOSIS — F419 Anxiety disorder, unspecified: Secondary | ICD-10-CM | POA: Diagnosis not present

## 2014-11-05 DIAGNOSIS — H540 Blindness, both eyes: Secondary | ICD-10-CM | POA: Diagnosis not present

## 2014-11-07 DIAGNOSIS — J449 Chronic obstructive pulmonary disease, unspecified: Secondary | ICD-10-CM | POA: Diagnosis not present

## 2014-11-07 DIAGNOSIS — F419 Anxiety disorder, unspecified: Secondary | ICD-10-CM | POA: Diagnosis not present

## 2014-11-07 DIAGNOSIS — H540 Blindness, both eyes: Secondary | ICD-10-CM | POA: Diagnosis not present

## 2014-11-07 DIAGNOSIS — R2689 Other abnormalities of gait and mobility: Secondary | ICD-10-CM | POA: Diagnosis not present

## 2014-11-07 DIAGNOSIS — M810 Age-related osteoporosis without current pathological fracture: Secondary | ICD-10-CM | POA: Diagnosis not present

## 2014-11-07 DIAGNOSIS — M1990 Unspecified osteoarthritis, unspecified site: Secondary | ICD-10-CM | POA: Diagnosis not present

## 2014-11-11 DIAGNOSIS — H540 Blindness, both eyes: Secondary | ICD-10-CM | POA: Diagnosis not present

## 2014-11-11 DIAGNOSIS — M1990 Unspecified osteoarthritis, unspecified site: Secondary | ICD-10-CM | POA: Diagnosis not present

## 2014-11-11 DIAGNOSIS — R2689 Other abnormalities of gait and mobility: Secondary | ICD-10-CM | POA: Diagnosis not present

## 2014-11-11 DIAGNOSIS — J449 Chronic obstructive pulmonary disease, unspecified: Secondary | ICD-10-CM | POA: Diagnosis not present

## 2014-11-11 DIAGNOSIS — M810 Age-related osteoporosis without current pathological fracture: Secondary | ICD-10-CM | POA: Diagnosis not present

## 2014-11-11 DIAGNOSIS — F419 Anxiety disorder, unspecified: Secondary | ICD-10-CM | POA: Diagnosis not present

## 2014-11-12 DIAGNOSIS — Z23 Encounter for immunization: Secondary | ICD-10-CM | POA: Diagnosis not present

## 2014-11-12 DIAGNOSIS — F419 Anxiety disorder, unspecified: Secondary | ICD-10-CM | POA: Diagnosis not present

## 2014-11-14 DIAGNOSIS — M810 Age-related osteoporosis without current pathological fracture: Secondary | ICD-10-CM | POA: Diagnosis not present

## 2014-11-14 DIAGNOSIS — R2689 Other abnormalities of gait and mobility: Secondary | ICD-10-CM | POA: Diagnosis not present

## 2014-11-14 DIAGNOSIS — M1990 Unspecified osteoarthritis, unspecified site: Secondary | ICD-10-CM | POA: Diagnosis not present

## 2014-11-14 DIAGNOSIS — J449 Chronic obstructive pulmonary disease, unspecified: Secondary | ICD-10-CM | POA: Diagnosis not present

## 2014-11-14 DIAGNOSIS — H540 Blindness, both eyes: Secondary | ICD-10-CM | POA: Diagnosis not present

## 2014-11-14 DIAGNOSIS — F419 Anxiety disorder, unspecified: Secondary | ICD-10-CM | POA: Diagnosis not present

## 2014-11-18 DIAGNOSIS — J449 Chronic obstructive pulmonary disease, unspecified: Secondary | ICD-10-CM | POA: Diagnosis not present

## 2014-11-18 DIAGNOSIS — F419 Anxiety disorder, unspecified: Secondary | ICD-10-CM | POA: Diagnosis not present

## 2014-11-18 DIAGNOSIS — M810 Age-related osteoporosis without current pathological fracture: Secondary | ICD-10-CM | POA: Diagnosis not present

## 2014-11-18 DIAGNOSIS — M1990 Unspecified osteoarthritis, unspecified site: Secondary | ICD-10-CM | POA: Diagnosis not present

## 2014-11-18 DIAGNOSIS — R2689 Other abnormalities of gait and mobility: Secondary | ICD-10-CM | POA: Diagnosis not present

## 2014-11-18 DIAGNOSIS — H540 Blindness, both eyes: Secondary | ICD-10-CM | POA: Diagnosis not present

## 2014-11-20 DIAGNOSIS — H540 Blindness, both eyes: Secondary | ICD-10-CM | POA: Diagnosis not present

## 2014-11-20 DIAGNOSIS — M1990 Unspecified osteoarthritis, unspecified site: Secondary | ICD-10-CM | POA: Diagnosis not present

## 2014-11-20 DIAGNOSIS — R2689 Other abnormalities of gait and mobility: Secondary | ICD-10-CM | POA: Diagnosis not present

## 2014-11-20 DIAGNOSIS — M810 Age-related osteoporosis without current pathological fracture: Secondary | ICD-10-CM | POA: Diagnosis not present

## 2014-11-20 DIAGNOSIS — J449 Chronic obstructive pulmonary disease, unspecified: Secondary | ICD-10-CM | POA: Diagnosis not present

## 2014-11-20 DIAGNOSIS — F419 Anxiety disorder, unspecified: Secondary | ICD-10-CM | POA: Diagnosis not present

## 2014-11-25 DIAGNOSIS — F419 Anxiety disorder, unspecified: Secondary | ICD-10-CM | POA: Diagnosis not present

## 2014-11-25 DIAGNOSIS — M1990 Unspecified osteoarthritis, unspecified site: Secondary | ICD-10-CM | POA: Diagnosis not present

## 2014-11-25 DIAGNOSIS — M810 Age-related osteoporosis without current pathological fracture: Secondary | ICD-10-CM | POA: Diagnosis not present

## 2014-11-25 DIAGNOSIS — H540 Blindness, both eyes: Secondary | ICD-10-CM | POA: Diagnosis not present

## 2014-11-25 DIAGNOSIS — R2689 Other abnormalities of gait and mobility: Secondary | ICD-10-CM | POA: Diagnosis not present

## 2014-11-25 DIAGNOSIS — J449 Chronic obstructive pulmonary disease, unspecified: Secondary | ICD-10-CM | POA: Diagnosis not present

## 2014-11-28 DIAGNOSIS — F419 Anxiety disorder, unspecified: Secondary | ICD-10-CM | POA: Diagnosis not present

## 2014-11-28 DIAGNOSIS — R2689 Other abnormalities of gait and mobility: Secondary | ICD-10-CM | POA: Diagnosis not present

## 2014-11-28 DIAGNOSIS — H540 Blindness, both eyes: Secondary | ICD-10-CM | POA: Diagnosis not present

## 2014-11-28 DIAGNOSIS — M1990 Unspecified osteoarthritis, unspecified site: Secondary | ICD-10-CM | POA: Diagnosis not present

## 2014-11-28 DIAGNOSIS — M810 Age-related osteoporosis without current pathological fracture: Secondary | ICD-10-CM | POA: Diagnosis not present

## 2014-11-28 DIAGNOSIS — J449 Chronic obstructive pulmonary disease, unspecified: Secondary | ICD-10-CM | POA: Diagnosis not present

## 2014-12-04 DIAGNOSIS — M810 Age-related osteoporosis without current pathological fracture: Secondary | ICD-10-CM | POA: Diagnosis not present

## 2014-12-04 DIAGNOSIS — F419 Anxiety disorder, unspecified: Secondary | ICD-10-CM | POA: Diagnosis not present

## 2014-12-04 DIAGNOSIS — R2689 Other abnormalities of gait and mobility: Secondary | ICD-10-CM | POA: Diagnosis not present

## 2014-12-04 DIAGNOSIS — M1990 Unspecified osteoarthritis, unspecified site: Secondary | ICD-10-CM | POA: Diagnosis not present

## 2014-12-04 DIAGNOSIS — H540 Blindness, both eyes: Secondary | ICD-10-CM | POA: Diagnosis not present

## 2014-12-04 DIAGNOSIS — J449 Chronic obstructive pulmonary disease, unspecified: Secondary | ICD-10-CM | POA: Diagnosis not present

## 2014-12-08 DIAGNOSIS — K047 Periapical abscess without sinus: Secondary | ICD-10-CM | POA: Diagnosis not present

## 2014-12-10 DIAGNOSIS — F419 Anxiety disorder, unspecified: Secondary | ICD-10-CM | POA: Diagnosis not present

## 2014-12-10 DIAGNOSIS — M810 Age-related osteoporosis without current pathological fracture: Secondary | ICD-10-CM | POA: Diagnosis not present

## 2014-12-10 DIAGNOSIS — J449 Chronic obstructive pulmonary disease, unspecified: Secondary | ICD-10-CM | POA: Diagnosis not present

## 2014-12-10 DIAGNOSIS — H540 Blindness, both eyes: Secondary | ICD-10-CM | POA: Diagnosis not present

## 2014-12-10 DIAGNOSIS — R2689 Other abnormalities of gait and mobility: Secondary | ICD-10-CM | POA: Diagnosis not present

## 2014-12-10 DIAGNOSIS — M1990 Unspecified osteoarthritis, unspecified site: Secondary | ICD-10-CM | POA: Diagnosis not present

## 2015-03-16 DIAGNOSIS — L603 Nail dystrophy: Secondary | ICD-10-CM | POA: Diagnosis not present

## 2015-03-23 DIAGNOSIS — H540 Blindness, both eyes: Secondary | ICD-10-CM | POA: Diagnosis not present

## 2015-03-23 DIAGNOSIS — J449 Chronic obstructive pulmonary disease, unspecified: Secondary | ICD-10-CM | POA: Diagnosis not present

## 2015-03-23 DIAGNOSIS — E559 Vitamin D deficiency, unspecified: Secondary | ICD-10-CM | POA: Diagnosis not present

## 2015-03-23 DIAGNOSIS — Z131 Encounter for screening for diabetes mellitus: Secondary | ICD-10-CM | POA: Diagnosis not present

## 2015-03-23 DIAGNOSIS — G4700 Insomnia, unspecified: Secondary | ICD-10-CM | POA: Diagnosis not present

## 2015-03-23 DIAGNOSIS — F419 Anxiety disorder, unspecified: Secondary | ICD-10-CM | POA: Diagnosis not present

## 2015-03-23 DIAGNOSIS — Z136 Encounter for screening for cardiovascular disorders: Secondary | ICD-10-CM | POA: Diagnosis not present

## 2015-04-02 DIAGNOSIS — Z85828 Personal history of other malignant neoplasm of skin: Secondary | ICD-10-CM | POA: Diagnosis not present

## 2015-04-02 DIAGNOSIS — L821 Other seborrheic keratosis: Secondary | ICD-10-CM | POA: Diagnosis not present

## 2015-09-09 DIAGNOSIS — R197 Diarrhea, unspecified: Secondary | ICD-10-CM | POA: Diagnosis not present

## 2015-09-18 DIAGNOSIS — R600 Localized edema: Secondary | ICD-10-CM | POA: Diagnosis not present

## 2015-09-23 DIAGNOSIS — F419 Anxiety disorder, unspecified: Secondary | ICD-10-CM | POA: Diagnosis not present

## 2015-09-23 DIAGNOSIS — R011 Cardiac murmur, unspecified: Secondary | ICD-10-CM | POA: Diagnosis not present

## 2015-09-23 DIAGNOSIS — R609 Edema, unspecified: Secondary | ICD-10-CM | POA: Diagnosis not present

## 2015-09-23 DIAGNOSIS — R930 Abnormal findings on diagnostic imaging of skull and head, not elsewhere classified: Secondary | ICD-10-CM | POA: Diagnosis not present

## 2015-09-23 DIAGNOSIS — E559 Vitamin D deficiency, unspecified: Secondary | ICD-10-CM | POA: Diagnosis not present

## 2015-09-23 DIAGNOSIS — M549 Dorsalgia, unspecified: Secondary | ICD-10-CM | POA: Diagnosis not present

## 2015-09-23 DIAGNOSIS — G4700 Insomnia, unspecified: Secondary | ICD-10-CM | POA: Diagnosis not present

## 2015-09-28 ENCOUNTER — Other Ambulatory Visit: Payer: Self-pay | Admitting: Family Medicine

## 2015-09-28 DIAGNOSIS — R011 Cardiac murmur, unspecified: Secondary | ICD-10-CM

## 2015-10-06 ENCOUNTER — Other Ambulatory Visit (HOSPITAL_COMMUNITY): Payer: Commercial Managed Care - HMO

## 2015-10-07 ENCOUNTER — Other Ambulatory Visit: Payer: Self-pay | Admitting: Family Medicine

## 2015-10-07 DIAGNOSIS — R930 Abnormal findings on diagnostic imaging of skull and head, not elsewhere classified: Secondary | ICD-10-CM

## 2015-10-08 ENCOUNTER — Telehealth: Payer: Self-pay | Admitting: Internal Medicine

## 2015-10-08 NOTE — Telephone Encounter (Signed)
8/29 Appointment cancelled per patient request.

## 2015-10-20 ENCOUNTER — Other Ambulatory Visit: Payer: Commercial Managed Care - HMO

## 2015-10-20 ENCOUNTER — Other Ambulatory Visit (HOSPITAL_COMMUNITY): Payer: Commercial Managed Care - HMO

## 2015-11-13 DIAGNOSIS — L02419 Cutaneous abscess of limb, unspecified: Secondary | ICD-10-CM | POA: Diagnosis not present

## 2015-11-13 DIAGNOSIS — Z23 Encounter for immunization: Secondary | ICD-10-CM | POA: Diagnosis not present

## 2015-11-16 DIAGNOSIS — L03119 Cellulitis of unspecified part of limb: Secondary | ICD-10-CM | POA: Diagnosis not present

## 2015-11-23 DIAGNOSIS — M160 Bilateral primary osteoarthritis of hip: Secondary | ICD-10-CM | POA: Diagnosis not present

## 2015-11-23 DIAGNOSIS — M4126 Other idiopathic scoliosis, lumbar region: Secondary | ICD-10-CM | POA: Diagnosis not present

## 2015-11-23 DIAGNOSIS — M6281 Muscle weakness (generalized): Secondary | ICD-10-CM | POA: Diagnosis not present

## 2015-11-23 DIAGNOSIS — R2681 Unsteadiness on feet: Secondary | ICD-10-CM | POA: Diagnosis not present

## 2015-11-23 DIAGNOSIS — Z9181 History of falling: Secondary | ICD-10-CM | POA: Diagnosis not present

## 2015-11-27 DIAGNOSIS — M6281 Muscle weakness (generalized): Secondary | ICD-10-CM | POA: Diagnosis not present

## 2015-11-27 DIAGNOSIS — M4126 Other idiopathic scoliosis, lumbar region: Secondary | ICD-10-CM | POA: Diagnosis not present

## 2015-11-27 DIAGNOSIS — R2681 Unsteadiness on feet: Secondary | ICD-10-CM | POA: Diagnosis not present

## 2015-11-27 DIAGNOSIS — Z9181 History of falling: Secondary | ICD-10-CM | POA: Diagnosis not present

## 2015-11-27 DIAGNOSIS — M160 Bilateral primary osteoarthritis of hip: Secondary | ICD-10-CM | POA: Diagnosis not present

## 2015-11-30 DIAGNOSIS — M4126 Other idiopathic scoliosis, lumbar region: Secondary | ICD-10-CM | POA: Diagnosis not present

## 2015-11-30 DIAGNOSIS — M160 Bilateral primary osteoarthritis of hip: Secondary | ICD-10-CM | POA: Diagnosis not present

## 2015-11-30 DIAGNOSIS — R2681 Unsteadiness on feet: Secondary | ICD-10-CM | POA: Diagnosis not present

## 2015-11-30 DIAGNOSIS — Z9181 History of falling: Secondary | ICD-10-CM | POA: Diagnosis not present

## 2015-11-30 DIAGNOSIS — M6281 Muscle weakness (generalized): Secondary | ICD-10-CM | POA: Diagnosis not present

## 2015-12-02 DIAGNOSIS — R2681 Unsteadiness on feet: Secondary | ICD-10-CM | POA: Diagnosis not present

## 2015-12-02 DIAGNOSIS — Z9181 History of falling: Secondary | ICD-10-CM | POA: Diagnosis not present

## 2015-12-02 DIAGNOSIS — M4126 Other idiopathic scoliosis, lumbar region: Secondary | ICD-10-CM | POA: Diagnosis not present

## 2015-12-02 DIAGNOSIS — M6281 Muscle weakness (generalized): Secondary | ICD-10-CM | POA: Diagnosis not present

## 2015-12-02 DIAGNOSIS — M160 Bilateral primary osteoarthritis of hip: Secondary | ICD-10-CM | POA: Diagnosis not present

## 2015-12-04 DIAGNOSIS — M6281 Muscle weakness (generalized): Secondary | ICD-10-CM | POA: Diagnosis not present

## 2015-12-04 DIAGNOSIS — Z9181 History of falling: Secondary | ICD-10-CM | POA: Diagnosis not present

## 2015-12-04 DIAGNOSIS — M160 Bilateral primary osteoarthritis of hip: Secondary | ICD-10-CM | POA: Diagnosis not present

## 2015-12-04 DIAGNOSIS — R2681 Unsteadiness on feet: Secondary | ICD-10-CM | POA: Diagnosis not present

## 2015-12-04 DIAGNOSIS — M4126 Other idiopathic scoliosis, lumbar region: Secondary | ICD-10-CM | POA: Diagnosis not present

## 2015-12-08 DIAGNOSIS — M160 Bilateral primary osteoarthritis of hip: Secondary | ICD-10-CM | POA: Diagnosis not present

## 2015-12-08 DIAGNOSIS — M4126 Other idiopathic scoliosis, lumbar region: Secondary | ICD-10-CM | POA: Diagnosis not present

## 2015-12-08 DIAGNOSIS — R2681 Unsteadiness on feet: Secondary | ICD-10-CM | POA: Diagnosis not present

## 2015-12-08 DIAGNOSIS — Z9181 History of falling: Secondary | ICD-10-CM | POA: Diagnosis not present

## 2015-12-08 DIAGNOSIS — M6281 Muscle weakness (generalized): Secondary | ICD-10-CM | POA: Diagnosis not present

## 2015-12-09 DIAGNOSIS — M160 Bilateral primary osteoarthritis of hip: Secondary | ICD-10-CM | POA: Diagnosis not present

## 2015-12-09 DIAGNOSIS — R2681 Unsteadiness on feet: Secondary | ICD-10-CM | POA: Diagnosis not present

## 2015-12-09 DIAGNOSIS — M6281 Muscle weakness (generalized): Secondary | ICD-10-CM | POA: Diagnosis not present

## 2015-12-09 DIAGNOSIS — Z9181 History of falling: Secondary | ICD-10-CM | POA: Diagnosis not present

## 2015-12-09 DIAGNOSIS — M4126 Other idiopathic scoliosis, lumbar region: Secondary | ICD-10-CM | POA: Diagnosis not present

## 2015-12-10 DIAGNOSIS — M4126 Other idiopathic scoliosis, lumbar region: Secondary | ICD-10-CM | POA: Diagnosis not present

## 2015-12-10 DIAGNOSIS — R2681 Unsteadiness on feet: Secondary | ICD-10-CM | POA: Diagnosis not present

## 2015-12-10 DIAGNOSIS — M6281 Muscle weakness (generalized): Secondary | ICD-10-CM | POA: Diagnosis not present

## 2015-12-10 DIAGNOSIS — M160 Bilateral primary osteoarthritis of hip: Secondary | ICD-10-CM | POA: Diagnosis not present

## 2015-12-10 DIAGNOSIS — Z9181 History of falling: Secondary | ICD-10-CM | POA: Diagnosis not present

## 2015-12-15 ENCOUNTER — Other Ambulatory Visit (HOSPITAL_COMMUNITY): Payer: Commercial Managed Care - HMO

## 2015-12-15 ENCOUNTER — Ambulatory Visit (HOSPITAL_COMMUNITY): Payer: PPO | Attending: Cardiology

## 2015-12-15 ENCOUNTER — Other Ambulatory Visit: Payer: Self-pay

## 2015-12-15 DIAGNOSIS — Z87891 Personal history of nicotine dependence: Secondary | ICD-10-CM | POA: Insufficient documentation

## 2015-12-15 DIAGNOSIS — I350 Nonrheumatic aortic (valve) stenosis: Secondary | ICD-10-CM | POA: Insufficient documentation

## 2015-12-15 DIAGNOSIS — R011 Cardiac murmur, unspecified: Secondary | ICD-10-CM | POA: Diagnosis not present

## 2015-12-16 DIAGNOSIS — M4126 Other idiopathic scoliosis, lumbar region: Secondary | ICD-10-CM | POA: Diagnosis not present

## 2015-12-16 DIAGNOSIS — M6281 Muscle weakness (generalized): Secondary | ICD-10-CM | POA: Diagnosis not present

## 2015-12-16 DIAGNOSIS — M160 Bilateral primary osteoarthritis of hip: Secondary | ICD-10-CM | POA: Diagnosis not present

## 2015-12-16 DIAGNOSIS — Z9181 History of falling: Secondary | ICD-10-CM | POA: Diagnosis not present

## 2015-12-16 DIAGNOSIS — R2681 Unsteadiness on feet: Secondary | ICD-10-CM | POA: Diagnosis not present

## 2015-12-17 DIAGNOSIS — M6281 Muscle weakness (generalized): Secondary | ICD-10-CM | POA: Diagnosis not present

## 2015-12-17 DIAGNOSIS — M4126 Other idiopathic scoliosis, lumbar region: Secondary | ICD-10-CM | POA: Diagnosis not present

## 2015-12-17 DIAGNOSIS — Z9181 History of falling: Secondary | ICD-10-CM | POA: Diagnosis not present

## 2015-12-17 DIAGNOSIS — R2681 Unsteadiness on feet: Secondary | ICD-10-CM | POA: Diagnosis not present

## 2015-12-17 DIAGNOSIS — M160 Bilateral primary osteoarthritis of hip: Secondary | ICD-10-CM | POA: Diagnosis not present

## 2015-12-18 DIAGNOSIS — M6281 Muscle weakness (generalized): Secondary | ICD-10-CM | POA: Diagnosis not present

## 2015-12-18 DIAGNOSIS — Z9181 History of falling: Secondary | ICD-10-CM | POA: Diagnosis not present

## 2015-12-18 DIAGNOSIS — M4126 Other idiopathic scoliosis, lumbar region: Secondary | ICD-10-CM | POA: Diagnosis not present

## 2015-12-18 DIAGNOSIS — M160 Bilateral primary osteoarthritis of hip: Secondary | ICD-10-CM | POA: Diagnosis not present

## 2015-12-18 DIAGNOSIS — R2681 Unsteadiness on feet: Secondary | ICD-10-CM | POA: Diagnosis not present

## 2015-12-21 DIAGNOSIS — M6281 Muscle weakness (generalized): Secondary | ICD-10-CM | POA: Diagnosis not present

## 2015-12-21 DIAGNOSIS — M160 Bilateral primary osteoarthritis of hip: Secondary | ICD-10-CM | POA: Diagnosis not present

## 2015-12-21 DIAGNOSIS — M4126 Other idiopathic scoliosis, lumbar region: Secondary | ICD-10-CM | POA: Diagnosis not present

## 2015-12-21 DIAGNOSIS — R2681 Unsteadiness on feet: Secondary | ICD-10-CM | POA: Diagnosis not present

## 2015-12-21 DIAGNOSIS — Z9181 History of falling: Secondary | ICD-10-CM | POA: Diagnosis not present

## 2015-12-22 DIAGNOSIS — M4126 Other idiopathic scoliosis, lumbar region: Secondary | ICD-10-CM | POA: Diagnosis not present

## 2015-12-22 DIAGNOSIS — Z9181 History of falling: Secondary | ICD-10-CM | POA: Diagnosis not present

## 2015-12-22 DIAGNOSIS — R2681 Unsteadiness on feet: Secondary | ICD-10-CM | POA: Diagnosis not present

## 2015-12-22 DIAGNOSIS — M160 Bilateral primary osteoarthritis of hip: Secondary | ICD-10-CM | POA: Diagnosis not present

## 2015-12-22 DIAGNOSIS — M6281 Muscle weakness (generalized): Secondary | ICD-10-CM | POA: Diagnosis not present

## 2015-12-23 DIAGNOSIS — R2681 Unsteadiness on feet: Secondary | ICD-10-CM | POA: Diagnosis not present

## 2015-12-23 DIAGNOSIS — M6281 Muscle weakness (generalized): Secondary | ICD-10-CM | POA: Diagnosis not present

## 2015-12-23 DIAGNOSIS — Z9181 History of falling: Secondary | ICD-10-CM | POA: Diagnosis not present

## 2015-12-23 DIAGNOSIS — M4126 Other idiopathic scoliosis, lumbar region: Secondary | ICD-10-CM | POA: Diagnosis not present

## 2015-12-23 DIAGNOSIS — M160 Bilateral primary osteoarthritis of hip: Secondary | ICD-10-CM | POA: Diagnosis not present

## 2015-12-24 DIAGNOSIS — M4126 Other idiopathic scoliosis, lumbar region: Secondary | ICD-10-CM | POA: Diagnosis not present

## 2015-12-24 DIAGNOSIS — R2681 Unsteadiness on feet: Secondary | ICD-10-CM | POA: Diagnosis not present

## 2015-12-24 DIAGNOSIS — Z9181 History of falling: Secondary | ICD-10-CM | POA: Diagnosis not present

## 2015-12-24 DIAGNOSIS — M160 Bilateral primary osteoarthritis of hip: Secondary | ICD-10-CM | POA: Diagnosis not present

## 2015-12-24 DIAGNOSIS — M6281 Muscle weakness (generalized): Secondary | ICD-10-CM | POA: Diagnosis not present

## 2015-12-25 DIAGNOSIS — R2681 Unsteadiness on feet: Secondary | ICD-10-CM | POA: Diagnosis not present

## 2015-12-25 DIAGNOSIS — M6281 Muscle weakness (generalized): Secondary | ICD-10-CM | POA: Diagnosis not present

## 2015-12-25 DIAGNOSIS — M160 Bilateral primary osteoarthritis of hip: Secondary | ICD-10-CM | POA: Diagnosis not present

## 2015-12-25 DIAGNOSIS — Z9181 History of falling: Secondary | ICD-10-CM | POA: Diagnosis not present

## 2015-12-25 DIAGNOSIS — M4126 Other idiopathic scoliosis, lumbar region: Secondary | ICD-10-CM | POA: Diagnosis not present

## 2015-12-28 DIAGNOSIS — R2681 Unsteadiness on feet: Secondary | ICD-10-CM | POA: Diagnosis not present

## 2015-12-28 DIAGNOSIS — Z9181 History of falling: Secondary | ICD-10-CM | POA: Diagnosis not present

## 2015-12-28 DIAGNOSIS — M6281 Muscle weakness (generalized): Secondary | ICD-10-CM | POA: Diagnosis not present

## 2015-12-28 DIAGNOSIS — M160 Bilateral primary osteoarthritis of hip: Secondary | ICD-10-CM | POA: Diagnosis not present

## 2015-12-28 DIAGNOSIS — M4126 Other idiopathic scoliosis, lumbar region: Secondary | ICD-10-CM | POA: Diagnosis not present

## 2015-12-29 DIAGNOSIS — Z9181 History of falling: Secondary | ICD-10-CM | POA: Diagnosis not present

## 2015-12-29 DIAGNOSIS — M4126 Other idiopathic scoliosis, lumbar region: Secondary | ICD-10-CM | POA: Diagnosis not present

## 2015-12-29 DIAGNOSIS — M160 Bilateral primary osteoarthritis of hip: Secondary | ICD-10-CM | POA: Diagnosis not present

## 2015-12-29 DIAGNOSIS — R2681 Unsteadiness on feet: Secondary | ICD-10-CM | POA: Diagnosis not present

## 2015-12-29 DIAGNOSIS — M6281 Muscle weakness (generalized): Secondary | ICD-10-CM | POA: Diagnosis not present

## 2015-12-31 DIAGNOSIS — Z9181 History of falling: Secondary | ICD-10-CM | POA: Diagnosis not present

## 2015-12-31 DIAGNOSIS — M4126 Other idiopathic scoliosis, lumbar region: Secondary | ICD-10-CM | POA: Diagnosis not present

## 2015-12-31 DIAGNOSIS — M6281 Muscle weakness (generalized): Secondary | ICD-10-CM | POA: Diagnosis not present

## 2015-12-31 DIAGNOSIS — M160 Bilateral primary osteoarthritis of hip: Secondary | ICD-10-CM | POA: Diagnosis not present

## 2015-12-31 DIAGNOSIS — R2681 Unsteadiness on feet: Secondary | ICD-10-CM | POA: Diagnosis not present

## 2016-01-01 DIAGNOSIS — M6281 Muscle weakness (generalized): Secondary | ICD-10-CM | POA: Diagnosis not present

## 2016-01-01 DIAGNOSIS — M160 Bilateral primary osteoarthritis of hip: Secondary | ICD-10-CM | POA: Diagnosis not present

## 2016-01-01 DIAGNOSIS — R2681 Unsteadiness on feet: Secondary | ICD-10-CM | POA: Diagnosis not present

## 2016-01-01 DIAGNOSIS — Z9181 History of falling: Secondary | ICD-10-CM | POA: Diagnosis not present

## 2016-01-01 DIAGNOSIS — M4126 Other idiopathic scoliosis, lumbar region: Secondary | ICD-10-CM | POA: Diagnosis not present

## 2016-01-04 DIAGNOSIS — M4126 Other idiopathic scoliosis, lumbar region: Secondary | ICD-10-CM | POA: Diagnosis not present

## 2016-01-04 DIAGNOSIS — Z9181 History of falling: Secondary | ICD-10-CM | POA: Diagnosis not present

## 2016-01-04 DIAGNOSIS — R2681 Unsteadiness on feet: Secondary | ICD-10-CM | POA: Diagnosis not present

## 2016-01-04 DIAGNOSIS — M6281 Muscle weakness (generalized): Secondary | ICD-10-CM | POA: Diagnosis not present

## 2016-01-04 DIAGNOSIS — M160 Bilateral primary osteoarthritis of hip: Secondary | ICD-10-CM | POA: Diagnosis not present

## 2016-01-06 DIAGNOSIS — Z9181 History of falling: Secondary | ICD-10-CM | POA: Diagnosis not present

## 2016-01-06 DIAGNOSIS — R2681 Unsteadiness on feet: Secondary | ICD-10-CM | POA: Diagnosis not present

## 2016-01-06 DIAGNOSIS — M6281 Muscle weakness (generalized): Secondary | ICD-10-CM | POA: Diagnosis not present

## 2016-01-06 DIAGNOSIS — M4126 Other idiopathic scoliosis, lumbar region: Secondary | ICD-10-CM | POA: Diagnosis not present

## 2016-01-06 DIAGNOSIS — M160 Bilateral primary osteoarthritis of hip: Secondary | ICD-10-CM | POA: Diagnosis not present

## 2016-01-07 DIAGNOSIS — M6281 Muscle weakness (generalized): Secondary | ICD-10-CM | POA: Diagnosis not present

## 2016-01-07 DIAGNOSIS — R2681 Unsteadiness on feet: Secondary | ICD-10-CM | POA: Diagnosis not present

## 2016-01-07 DIAGNOSIS — M4126 Other idiopathic scoliosis, lumbar region: Secondary | ICD-10-CM | POA: Diagnosis not present

## 2016-01-07 DIAGNOSIS — M160 Bilateral primary osteoarthritis of hip: Secondary | ICD-10-CM | POA: Diagnosis not present

## 2016-01-07 DIAGNOSIS — Z9181 History of falling: Secondary | ICD-10-CM | POA: Diagnosis not present

## 2016-01-08 DIAGNOSIS — Z9181 History of falling: Secondary | ICD-10-CM | POA: Diagnosis not present

## 2016-01-08 DIAGNOSIS — R2681 Unsteadiness on feet: Secondary | ICD-10-CM | POA: Diagnosis not present

## 2016-01-08 DIAGNOSIS — M4126 Other idiopathic scoliosis, lumbar region: Secondary | ICD-10-CM | POA: Diagnosis not present

## 2016-01-08 DIAGNOSIS — M160 Bilateral primary osteoarthritis of hip: Secondary | ICD-10-CM | POA: Diagnosis not present

## 2016-01-08 DIAGNOSIS — M6281 Muscle weakness (generalized): Secondary | ICD-10-CM | POA: Diagnosis not present

## 2016-01-11 DIAGNOSIS — M4126 Other idiopathic scoliosis, lumbar region: Secondary | ICD-10-CM | POA: Diagnosis not present

## 2016-01-11 DIAGNOSIS — Z9181 History of falling: Secondary | ICD-10-CM | POA: Diagnosis not present

## 2016-01-11 DIAGNOSIS — M6281 Muscle weakness (generalized): Secondary | ICD-10-CM | POA: Diagnosis not present

## 2016-01-11 DIAGNOSIS — R2681 Unsteadiness on feet: Secondary | ICD-10-CM | POA: Diagnosis not present

## 2016-01-11 DIAGNOSIS — M160 Bilateral primary osteoarthritis of hip: Secondary | ICD-10-CM | POA: Diagnosis not present

## 2016-01-12 DIAGNOSIS — Z9181 History of falling: Secondary | ICD-10-CM | POA: Diagnosis not present

## 2016-01-12 DIAGNOSIS — R2681 Unsteadiness on feet: Secondary | ICD-10-CM | POA: Diagnosis not present

## 2016-01-12 DIAGNOSIS — M6281 Muscle weakness (generalized): Secondary | ICD-10-CM | POA: Diagnosis not present

## 2016-01-12 DIAGNOSIS — M160 Bilateral primary osteoarthritis of hip: Secondary | ICD-10-CM | POA: Diagnosis not present

## 2016-01-12 DIAGNOSIS — M4126 Other idiopathic scoliosis, lumbar region: Secondary | ICD-10-CM | POA: Diagnosis not present

## 2016-01-15 DIAGNOSIS — M6281 Muscle weakness (generalized): Secondary | ICD-10-CM | POA: Diagnosis not present

## 2016-01-15 DIAGNOSIS — R2681 Unsteadiness on feet: Secondary | ICD-10-CM | POA: Diagnosis not present

## 2016-01-15 DIAGNOSIS — Z9181 History of falling: Secondary | ICD-10-CM | POA: Diagnosis not present

## 2016-01-15 DIAGNOSIS — M4126 Other idiopathic scoliosis, lumbar region: Secondary | ICD-10-CM | POA: Diagnosis not present

## 2016-01-15 DIAGNOSIS — M160 Bilateral primary osteoarthritis of hip: Secondary | ICD-10-CM | POA: Diagnosis not present

## 2016-01-18 DIAGNOSIS — M6281 Muscle weakness (generalized): Secondary | ICD-10-CM | POA: Diagnosis not present

## 2016-01-18 DIAGNOSIS — M160 Bilateral primary osteoarthritis of hip: Secondary | ICD-10-CM | POA: Diagnosis not present

## 2016-01-18 DIAGNOSIS — R2681 Unsteadiness on feet: Secondary | ICD-10-CM | POA: Diagnosis not present

## 2016-01-18 DIAGNOSIS — Z9181 History of falling: Secondary | ICD-10-CM | POA: Diagnosis not present

## 2016-01-18 DIAGNOSIS — M4126 Other idiopathic scoliosis, lumbar region: Secondary | ICD-10-CM | POA: Diagnosis not present

## 2016-01-19 DIAGNOSIS — M6281 Muscle weakness (generalized): Secondary | ICD-10-CM | POA: Diagnosis not present

## 2016-01-19 DIAGNOSIS — M4126 Other idiopathic scoliosis, lumbar region: Secondary | ICD-10-CM | POA: Diagnosis not present

## 2016-01-19 DIAGNOSIS — Z9181 History of falling: Secondary | ICD-10-CM | POA: Diagnosis not present

## 2016-01-19 DIAGNOSIS — M160 Bilateral primary osteoarthritis of hip: Secondary | ICD-10-CM | POA: Diagnosis not present

## 2016-01-19 DIAGNOSIS — R2681 Unsteadiness on feet: Secondary | ICD-10-CM | POA: Diagnosis not present

## 2016-01-21 DIAGNOSIS — M4126 Other idiopathic scoliosis, lumbar region: Secondary | ICD-10-CM | POA: Diagnosis not present

## 2016-01-21 DIAGNOSIS — M6281 Muscle weakness (generalized): Secondary | ICD-10-CM | POA: Diagnosis not present

## 2016-01-21 DIAGNOSIS — R2681 Unsteadiness on feet: Secondary | ICD-10-CM | POA: Diagnosis not present

## 2016-01-21 DIAGNOSIS — M160 Bilateral primary osteoarthritis of hip: Secondary | ICD-10-CM | POA: Diagnosis not present

## 2016-01-21 DIAGNOSIS — Z9181 History of falling: Secondary | ICD-10-CM | POA: Diagnosis not present

## 2016-01-22 DIAGNOSIS — R2681 Unsteadiness on feet: Secondary | ICD-10-CM | POA: Diagnosis not present

## 2016-01-22 DIAGNOSIS — M6281 Muscle weakness (generalized): Secondary | ICD-10-CM | POA: Diagnosis not present

## 2016-01-22 DIAGNOSIS — Z9181 History of falling: Secondary | ICD-10-CM | POA: Diagnosis not present

## 2016-01-22 DIAGNOSIS — M4126 Other idiopathic scoliosis, lumbar region: Secondary | ICD-10-CM | POA: Diagnosis not present

## 2016-01-22 DIAGNOSIS — M160 Bilateral primary osteoarthritis of hip: Secondary | ICD-10-CM | POA: Diagnosis not present

## 2016-01-25 DIAGNOSIS — Z9181 History of falling: Secondary | ICD-10-CM | POA: Diagnosis not present

## 2016-01-25 DIAGNOSIS — M160 Bilateral primary osteoarthritis of hip: Secondary | ICD-10-CM | POA: Diagnosis not present

## 2016-01-25 DIAGNOSIS — M6281 Muscle weakness (generalized): Secondary | ICD-10-CM | POA: Diagnosis not present

## 2016-01-25 DIAGNOSIS — M4126 Other idiopathic scoliosis, lumbar region: Secondary | ICD-10-CM | POA: Diagnosis not present

## 2016-01-25 DIAGNOSIS — R2681 Unsteadiness on feet: Secondary | ICD-10-CM | POA: Diagnosis not present

## 2016-01-27 DIAGNOSIS — Z9181 History of falling: Secondary | ICD-10-CM | POA: Diagnosis not present

## 2016-01-27 DIAGNOSIS — M160 Bilateral primary osteoarthritis of hip: Secondary | ICD-10-CM | POA: Diagnosis not present

## 2016-01-27 DIAGNOSIS — M4126 Other idiopathic scoliosis, lumbar region: Secondary | ICD-10-CM | POA: Diagnosis not present

## 2016-01-27 DIAGNOSIS — M6281 Muscle weakness (generalized): Secondary | ICD-10-CM | POA: Diagnosis not present

## 2016-01-27 DIAGNOSIS — R2681 Unsteadiness on feet: Secondary | ICD-10-CM | POA: Diagnosis not present

## 2016-01-29 DIAGNOSIS — M160 Bilateral primary osteoarthritis of hip: Secondary | ICD-10-CM | POA: Diagnosis not present

## 2016-01-29 DIAGNOSIS — M4126 Other idiopathic scoliosis, lumbar region: Secondary | ICD-10-CM | POA: Diagnosis not present

## 2016-01-29 DIAGNOSIS — M6281 Muscle weakness (generalized): Secondary | ICD-10-CM | POA: Diagnosis not present

## 2016-01-29 DIAGNOSIS — Z9181 History of falling: Secondary | ICD-10-CM | POA: Diagnosis not present

## 2016-01-29 DIAGNOSIS — R2681 Unsteadiness on feet: Secondary | ICD-10-CM | POA: Diagnosis not present

## 2016-02-01 DIAGNOSIS — M160 Bilateral primary osteoarthritis of hip: Secondary | ICD-10-CM | POA: Diagnosis not present

## 2016-02-01 DIAGNOSIS — Z9181 History of falling: Secondary | ICD-10-CM | POA: Diagnosis not present

## 2016-02-01 DIAGNOSIS — R2681 Unsteadiness on feet: Secondary | ICD-10-CM | POA: Diagnosis not present

## 2016-02-01 DIAGNOSIS — M6281 Muscle weakness (generalized): Secondary | ICD-10-CM | POA: Diagnosis not present

## 2016-02-01 DIAGNOSIS — M4126 Other idiopathic scoliosis, lumbar region: Secondary | ICD-10-CM | POA: Diagnosis not present

## 2016-02-05 DIAGNOSIS — Z9181 History of falling: Secondary | ICD-10-CM | POA: Diagnosis not present

## 2016-02-05 DIAGNOSIS — M6281 Muscle weakness (generalized): Secondary | ICD-10-CM | POA: Diagnosis not present

## 2016-02-05 DIAGNOSIS — M4126 Other idiopathic scoliosis, lumbar region: Secondary | ICD-10-CM | POA: Diagnosis not present

## 2016-02-05 DIAGNOSIS — M160 Bilateral primary osteoarthritis of hip: Secondary | ICD-10-CM | POA: Diagnosis not present

## 2016-02-05 DIAGNOSIS — R2681 Unsteadiness on feet: Secondary | ICD-10-CM | POA: Diagnosis not present

## 2016-02-17 DIAGNOSIS — R2681 Unsteadiness on feet: Secondary | ICD-10-CM | POA: Diagnosis not present

## 2016-02-17 DIAGNOSIS — M160 Bilateral primary osteoarthritis of hip: Secondary | ICD-10-CM | POA: Diagnosis not present

## 2016-02-17 DIAGNOSIS — M4126 Other idiopathic scoliosis, lumbar region: Secondary | ICD-10-CM | POA: Diagnosis not present

## 2016-02-17 DIAGNOSIS — M6281 Muscle weakness (generalized): Secondary | ICD-10-CM | POA: Diagnosis not present

## 2016-02-17 DIAGNOSIS — Z9181 History of falling: Secondary | ICD-10-CM | POA: Diagnosis not present

## 2016-02-19 DIAGNOSIS — M160 Bilateral primary osteoarthritis of hip: Secondary | ICD-10-CM | POA: Diagnosis not present

## 2016-02-19 DIAGNOSIS — R2681 Unsteadiness on feet: Secondary | ICD-10-CM | POA: Diagnosis not present

## 2016-02-19 DIAGNOSIS — Z9181 History of falling: Secondary | ICD-10-CM | POA: Diagnosis not present

## 2016-02-19 DIAGNOSIS — M4126 Other idiopathic scoliosis, lumbar region: Secondary | ICD-10-CM | POA: Diagnosis not present

## 2016-02-19 DIAGNOSIS — M6281 Muscle weakness (generalized): Secondary | ICD-10-CM | POA: Diagnosis not present

## 2016-02-29 DIAGNOSIS — M160 Bilateral primary osteoarthritis of hip: Secondary | ICD-10-CM | POA: Diagnosis not present

## 2016-02-29 DIAGNOSIS — R2681 Unsteadiness on feet: Secondary | ICD-10-CM | POA: Diagnosis not present

## 2016-02-29 DIAGNOSIS — M4126 Other idiopathic scoliosis, lumbar region: Secondary | ICD-10-CM | POA: Diagnosis not present

## 2016-02-29 DIAGNOSIS — M6281 Muscle weakness (generalized): Secondary | ICD-10-CM | POA: Diagnosis not present

## 2016-03-01 DIAGNOSIS — M4126 Other idiopathic scoliosis, lumbar region: Secondary | ICD-10-CM | POA: Diagnosis not present

## 2016-03-01 DIAGNOSIS — M6281 Muscle weakness (generalized): Secondary | ICD-10-CM | POA: Diagnosis not present

## 2016-03-01 DIAGNOSIS — M160 Bilateral primary osteoarthritis of hip: Secondary | ICD-10-CM | POA: Diagnosis not present

## 2016-03-01 DIAGNOSIS — R2681 Unsteadiness on feet: Secondary | ICD-10-CM | POA: Diagnosis not present

## 2016-03-02 DIAGNOSIS — M4126 Other idiopathic scoliosis, lumbar region: Secondary | ICD-10-CM | POA: Diagnosis not present

## 2016-03-02 DIAGNOSIS — M160 Bilateral primary osteoarthritis of hip: Secondary | ICD-10-CM | POA: Diagnosis not present

## 2016-03-02 DIAGNOSIS — R2681 Unsteadiness on feet: Secondary | ICD-10-CM | POA: Diagnosis not present

## 2016-03-02 DIAGNOSIS — M6281 Muscle weakness (generalized): Secondary | ICD-10-CM | POA: Diagnosis not present

## 2016-03-05 DIAGNOSIS — R21 Rash and other nonspecific skin eruption: Secondary | ICD-10-CM | POA: Diagnosis not present

## 2016-03-05 DIAGNOSIS — N7689 Other specified inflammation of vagina and vulva: Secondary | ICD-10-CM | POA: Diagnosis not present

## 2016-03-11 DIAGNOSIS — M6281 Muscle weakness (generalized): Secondary | ICD-10-CM | POA: Diagnosis not present

## 2016-03-11 DIAGNOSIS — M160 Bilateral primary osteoarthritis of hip: Secondary | ICD-10-CM | POA: Diagnosis not present

## 2016-03-11 DIAGNOSIS — M4126 Other idiopathic scoliosis, lumbar region: Secondary | ICD-10-CM | POA: Diagnosis not present

## 2016-03-11 DIAGNOSIS — R2681 Unsteadiness on feet: Secondary | ICD-10-CM | POA: Diagnosis not present

## 2016-03-16 ENCOUNTER — Emergency Department (HOSPITAL_BASED_OUTPATIENT_CLINIC_OR_DEPARTMENT_OTHER)
Admission: EM | Admit: 2016-03-16 | Discharge: 2016-03-16 | Disposition: A | Discharge status: Home / Self Care | Payer: PPO | Attending: Emergency Medicine | Admitting: Emergency Medicine

## 2016-03-16 ENCOUNTER — Emergency Department (HOSPITAL_BASED_OUTPATIENT_CLINIC_OR_DEPARTMENT_OTHER): Payer: PPO

## 2016-03-16 ENCOUNTER — Encounter (HOSPITAL_BASED_OUTPATIENT_CLINIC_OR_DEPARTMENT_OTHER): Payer: Self-pay

## 2016-03-16 DIAGNOSIS — K5900 Constipation, unspecified: Secondary | ICD-10-CM

## 2016-03-16 DIAGNOSIS — Z87891 Personal history of nicotine dependence: Secondary | ICD-10-CM | POA: Insufficient documentation

## 2016-03-16 DIAGNOSIS — K6289 Other specified diseases of anus and rectum: Secondary | ICD-10-CM | POA: Diagnosis not present

## 2016-03-16 DIAGNOSIS — K5641 Fecal impaction: Secondary | ICD-10-CM | POA: Diagnosis not present

## 2016-03-16 MED ORDER — POLYETHYLENE GLYCOL 3350 17 G PO PACK: 17.0000 g | PACK | Freq: Every day | ORAL | 0 refills | Status: DC

## 2016-03-16 MED ORDER — DOCUSATE SODIUM 100 MG PO CAPS: 100.0000 mg | ORAL_CAPSULE | Freq: Two times a day (BID) | ORAL | 0 refills | Status: DC

## 2016-03-16 NOTE — ED Triage Notes (Signed)
Pt states she was sent from PCP for disimpaction-reports last BM 11 days ago-pt NAD-presents to triage in w/c-states her son is with her

## 2016-03-16 NOTE — ED Notes (Signed)
ED Provider at bedside. 

## 2016-03-16 NOTE — ED Notes (Signed)
Large amount of soft stool disimpacted , enema to follow

## 2016-03-16 NOTE — ED Provider Notes (Signed)
Donna Webster Provider Note   CSN: [id number] Arrival date & time: 03/16/16  1757     History   Chief Complaint Chief Complaint  Patient presents with  . Fecal Impaction    HPI Donna Webster is a 83 y.o. female.  HPI Patient presents with constipation. Last bowel movement was 11 days ago. Seen by her PCP and sent for impaction. Denies nausea or vomiting. Denies abdominal pain. Past Medical History:  Diagnosis Date  . Anxiety   . Arthritis    osteo  . Depression   . Histoplasmosis   . History of skin cancer    facail  . Macular degeneration   . UTI (urinary tract infection) 01/29/2014    Patient Active Problem List   Diagnosis Date Noted  . Protein-calorie malnutrition, severe ([place]) 02/11/2014  . Altered mental state 02/10/2014  . Acute encephalopathy 02/10/2014  . Rash and nonspecific skin eruption 02/10/2014  . Drug reaction 02/10/2014  . UTI (lower urinary tract infection) 01/28/2014  . Altered mental status 01/28/2014  . Hyponatremia 01/28/2014  . Abnormal liver function 01/28/2014    Past Surgical History:  Procedure Laterality Date  . ANAL FISSURE REPAIR    . BREAST LUMPECTOMY Left   . COSMETIC SURGERY    . EYE SURGERY     detached retina    . sugery of female organs      1950's    OB History    No data available       Home Medications    Prior to Admission medications   Medication Sig Start Date End Date Taking? Authorizing Provider  ALPRAZolam Duanne Moron) 0.5 MG tablet Take 0.5 tablets (0.25 mg total) by mouth 3 (three) times daily. 02/13/14   Kinnie Feil, MD  docusate sodium (COLACE) 100 MG capsule Take 1 capsule (100 mg total) by mouth every 12 (twelve) hours. 03/16/16   Julianne Rice, MD  polyethylene glycol [facility] / Floria Raveling) packet Take 17 g by mouth daily. 03/16/16   Julianne Rice, MD  traZODone (DESYREL) 50 MG tablet Take 100 mg by mouth at bedtime.  12/23/13   Historical Provider, MD    Family History Family  History  Problem Relation Age of Onset  . Parkinsonism Father     Social History Social History  Substance Use Topics  . Smoking status: Former Research scientist (life sciences)  . Smokeless tobacco: Never Used     Comment: qiuit smoking in 1992  . Alcohol use No     Allergies   Penicillins; Sulfur; and Levofloxacin   Review of Systems Review of Systems  Constitutional: Negative for chills and fever.  Respiratory: Negative for shortness of breath.   Cardiovascular: Negative for chest pain.  Gastrointestinal: Positive for constipation. Negative for abdominal pain, blood in stool, diarrhea, nausea and vomiting.  Musculoskeletal: Negative for back pain, myalgias, neck pain and neck stiffness.  Skin: Negative for rash and wound.  Neurological: Negative for dizziness, weakness, light-headedness, numbness and headaches.  All other systems reviewed and are negative.    Physical Exam Updated Vital Signs BP 162/96 (BP Location: Left Arm)   Pulse 67   Temp 98 F (36.7 C) (Oral)   Resp 19   Ht 5\' 1"  (1.549 m)   Wt 125 lb (56.7 kg)   SpO2 98%   BMI 23.62 kg/m   Physical Exam  Constitutional: She is oriented to person, place, and time. She appears well-developed and well-nourished.  HENT:  Head: Normocephalic and atraumatic.  Mouth/Throat: Oropharynx is  clear and moist.  Eyes: EOM are normal. Pupils are equal, round, and reactive to light.  Neck: Normal range of motion. Neck supple.  Cardiovascular: Normal rate and regular rhythm.   Pulmonary/Chest: Effort normal and breath sounds normal.  Abdominal: Soft. Bowel sounds are normal. She exhibits no distension and no mass. There is no tenderness. There is no rebound and no guarding. No hernia.  Musculoskeletal: Normal range of motion. She exhibits no edema or tenderness.  Neurological: She is alert and oriented to person, place, and time.  Moving all extremities without deficit. Sensation intact.  Skin: Skin is warm and dry. No rash noted. No  erythema.  Psychiatric: She has a normal mood and affect. Her behavior is normal.  Nursing note and vitals reviewed.    ED Treatments / Results  Labs (all labs ordered are listed, but only abnormal results are displayed) Labs Reviewed - No data to display  EKG  EKG Interpretation None       Radiology Dg Abdomen 1 View  Result Date: 03/16/2016 CLINICAL DATA:  Acute onset of rectal pressure. Assess for fecal impaction. Initial encounter. EXAM: ABDOMEN - 1 VIEW COMPARISON:  CT of the abdomen and pelvis from 01/29/2014 FINDINGS: The rectum is markedly distended with dense stool, measuring 12.5 cm in diameter. A moderate to large amount of stool noted in the remainder of the colon. No free intra-abdominal air is seen, though evaluation for free air is limited on a single supine view. Mild right convex lumbar scoliosis is noted, with underlying degenerative change. IMPRESSION: 1. Rectum markedly distended with dense stool, measuring 12.5 cm in diameter, compatible with significant fecal impaction. 2. Moderate to large amount of stool noted in the remainder of the colon, reflecting constipation. 3. Mild right convex lumbar scoliosis. Electronically Signed   By: Garald Balding M.D.   On: 03/16/2016 19:38    Procedures Procedures (including critical care time)  Medications Ordered in ED Medications - No data to display   Initial Impression / Assessment and Plan / ED Course  I have reviewed the triage vital signs and the nursing notes.  Pertinent labs & imaging results that were available during my care of the patient were reviewed by me and considered in my medical decision making (see chart for details).     Patient disimpacted in the emergency department with fleets enema. Multiple large bowel movements. Patient states she is much better. Abdomen remained soft and nontender. Will discharge home on Colace and MiraLAX. Patient is to follow-up with her primary physician. Return  precautions given.  Final Clinical Impressions(s) / ED Diagnoses   Final diagnoses:  Constipation, unspecified constipation type    New Prescriptions New Prescriptions   DOCUSATE SODIUM (COLACE) 100 MG CAPSULE    Take 1 capsule (100 mg total) by mouth every 12 (twelve) hours.   POLYETHYLENE GLYCOL (MIRALAX / GLYCOLAX) PACKET    Take 17 g by mouth daily.     Julianne Rice, MD 03/16/16 2226

## 2016-03-28 DIAGNOSIS — K5900 Constipation, unspecified: Secondary | ICD-10-CM | POA: Diagnosis not present

## 2016-03-28 DIAGNOSIS — G4700 Insomnia, unspecified: Secondary | ICD-10-CM | POA: Diagnosis not present

## 2016-03-28 DIAGNOSIS — F419 Anxiety disorder, unspecified: Secondary | ICD-10-CM | POA: Diagnosis not present

## 2016-04-07 DIAGNOSIS — K5641 Fecal impaction: Secondary | ICD-10-CM | POA: Diagnosis not present

## 2016-04-07 DIAGNOSIS — K589 Irritable bowel syndrome without diarrhea: Secondary | ICD-10-CM | POA: Diagnosis not present

## 2016-04-07 DIAGNOSIS — H548 Legal blindness, as defined in USA: Secondary | ICD-10-CM | POA: Diagnosis not present

## 2016-04-07 DIAGNOSIS — M810 Age-related osteoporosis without current pathological fracture: Secondary | ICD-10-CM | POA: Diagnosis not present

## 2016-04-07 DIAGNOSIS — F0390 Unspecified dementia without behavioral disturbance: Secondary | ICD-10-CM | POA: Diagnosis not present

## 2016-04-07 DIAGNOSIS — F419 Anxiety disorder, unspecified: Secondary | ICD-10-CM | POA: Diagnosis not present

## 2016-04-07 DIAGNOSIS — I350 Nonrheumatic aortic (valve) stenosis: Secondary | ICD-10-CM | POA: Diagnosis not present

## 2016-04-07 DIAGNOSIS — M150 Primary generalized (osteo)arthritis: Secondary | ICD-10-CM | POA: Diagnosis not present

## 2016-04-07 DIAGNOSIS — B399 Histoplasmosis, unspecified: Secondary | ICD-10-CM | POA: Diagnosis not present

## 2016-04-07 DIAGNOSIS — H32 Chorioretinal disorders in diseases classified elsewhere: Secondary | ICD-10-CM | POA: Diagnosis not present

## 2016-04-07 DIAGNOSIS — D329 Benign neoplasm of meninges, unspecified: Secondary | ICD-10-CM | POA: Diagnosis not present

## 2016-04-07 DIAGNOSIS — F329 Major depressive disorder, single episode, unspecified: Secondary | ICD-10-CM | POA: Diagnosis not present

## 2016-04-20 DIAGNOSIS — F419 Anxiety disorder, unspecified: Secondary | ICD-10-CM | POA: Diagnosis not present

## 2016-04-20 DIAGNOSIS — D329 Benign neoplasm of meninges, unspecified: Secondary | ICD-10-CM | POA: Diagnosis not present

## 2016-04-20 DIAGNOSIS — H32 Chorioretinal disorders in diseases classified elsewhere: Secondary | ICD-10-CM | POA: Diagnosis not present

## 2016-04-20 DIAGNOSIS — H548 Legal blindness, as defined in USA: Secondary | ICD-10-CM | POA: Diagnosis not present

## 2016-04-20 DIAGNOSIS — K589 Irritable bowel syndrome without diarrhea: Secondary | ICD-10-CM | POA: Diagnosis not present

## 2016-04-20 DIAGNOSIS — K5641 Fecal impaction: Secondary | ICD-10-CM | POA: Diagnosis not present

## 2016-04-20 DIAGNOSIS — M150 Primary generalized (osteo)arthritis: Secondary | ICD-10-CM | POA: Diagnosis not present

## 2016-04-20 DIAGNOSIS — F329 Major depressive disorder, single episode, unspecified: Secondary | ICD-10-CM | POA: Diagnosis not present

## 2016-04-20 DIAGNOSIS — I350 Nonrheumatic aortic (valve) stenosis: Secondary | ICD-10-CM | POA: Diagnosis not present

## 2016-04-20 DIAGNOSIS — B399 Histoplasmosis, unspecified: Secondary | ICD-10-CM | POA: Diagnosis not present

## 2016-04-20 DIAGNOSIS — M810 Age-related osteoporosis without current pathological fracture: Secondary | ICD-10-CM | POA: Diagnosis not present

## 2016-04-20 DIAGNOSIS — F0390 Unspecified dementia without behavioral disturbance: Secondary | ICD-10-CM | POA: Diagnosis not present

## 2016-04-21 DIAGNOSIS — F329 Major depressive disorder, single episode, unspecified: Secondary | ICD-10-CM | POA: Diagnosis not present

## 2016-04-21 DIAGNOSIS — B399 Histoplasmosis, unspecified: Secondary | ICD-10-CM | POA: Diagnosis not present

## 2016-04-21 DIAGNOSIS — I350 Nonrheumatic aortic (valve) stenosis: Secondary | ICD-10-CM | POA: Diagnosis not present

## 2016-04-21 DIAGNOSIS — K5641 Fecal impaction: Secondary | ICD-10-CM | POA: Diagnosis not present

## 2016-04-21 DIAGNOSIS — M150 Primary generalized (osteo)arthritis: Secondary | ICD-10-CM | POA: Diagnosis not present

## 2016-04-21 DIAGNOSIS — M810 Age-related osteoporosis without current pathological fracture: Secondary | ICD-10-CM | POA: Diagnosis not present

## 2016-04-21 DIAGNOSIS — H548 Legal blindness, as defined in USA: Secondary | ICD-10-CM | POA: Diagnosis not present

## 2016-04-21 DIAGNOSIS — K589 Irritable bowel syndrome without diarrhea: Secondary | ICD-10-CM | POA: Diagnosis not present

## 2016-04-21 DIAGNOSIS — H32 Chorioretinal disorders in diseases classified elsewhere: Secondary | ICD-10-CM | POA: Diagnosis not present

## 2016-04-21 DIAGNOSIS — F419 Anxiety disorder, unspecified: Secondary | ICD-10-CM | POA: Diagnosis not present

## 2016-04-21 DIAGNOSIS — F0390 Unspecified dementia without behavioral disturbance: Secondary | ICD-10-CM | POA: Diagnosis not present

## 2016-04-21 DIAGNOSIS — D329 Benign neoplasm of meninges, unspecified: Secondary | ICD-10-CM | POA: Diagnosis not present

## 2016-04-22 DIAGNOSIS — D329 Benign neoplasm of meninges, unspecified: Secondary | ICD-10-CM | POA: Diagnosis not present

## 2016-04-22 DIAGNOSIS — K5641 Fecal impaction: Secondary | ICD-10-CM | POA: Diagnosis not present

## 2016-04-22 DIAGNOSIS — H548 Legal blindness, as defined in USA: Secondary | ICD-10-CM | POA: Diagnosis not present

## 2016-04-22 DIAGNOSIS — M150 Primary generalized (osteo)arthritis: Secondary | ICD-10-CM | POA: Diagnosis not present

## 2016-04-22 DIAGNOSIS — K589 Irritable bowel syndrome without diarrhea: Secondary | ICD-10-CM | POA: Diagnosis not present

## 2016-04-22 DIAGNOSIS — H32 Chorioretinal disorders in diseases classified elsewhere: Secondary | ICD-10-CM | POA: Diagnosis not present

## 2016-04-22 DIAGNOSIS — I350 Nonrheumatic aortic (valve) stenosis: Secondary | ICD-10-CM | POA: Diagnosis not present

## 2016-04-22 DIAGNOSIS — F0390 Unspecified dementia without behavioral disturbance: Secondary | ICD-10-CM | POA: Diagnosis not present

## 2016-04-22 DIAGNOSIS — F419 Anxiety disorder, unspecified: Secondary | ICD-10-CM | POA: Diagnosis not present

## 2016-04-22 DIAGNOSIS — F329 Major depressive disorder, single episode, unspecified: Secondary | ICD-10-CM | POA: Diagnosis not present

## 2016-04-22 DIAGNOSIS — B399 Histoplasmosis, unspecified: Secondary | ICD-10-CM | POA: Diagnosis not present

## 2016-04-22 DIAGNOSIS — M810 Age-related osteoporosis without current pathological fracture: Secondary | ICD-10-CM | POA: Diagnosis not present

## 2016-04-25 DIAGNOSIS — K5900 Constipation, unspecified: Secondary | ICD-10-CM | POA: Diagnosis not present

## 2016-04-25 DIAGNOSIS — F419 Anxiety disorder, unspecified: Secondary | ICD-10-CM | POA: Diagnosis not present

## 2016-04-25 DIAGNOSIS — N3281 Overactive bladder: Secondary | ICD-10-CM | POA: Diagnosis not present

## 2016-04-25 DIAGNOSIS — I350 Nonrheumatic aortic (valve) stenosis: Secondary | ICD-10-CM | POA: Diagnosis not present

## 2016-04-29 DIAGNOSIS — B399 Histoplasmosis, unspecified: Secondary | ICD-10-CM | POA: Diagnosis not present

## 2016-04-29 DIAGNOSIS — M150 Primary generalized (osteo)arthritis: Secondary | ICD-10-CM | POA: Diagnosis not present

## 2016-04-29 DIAGNOSIS — H548 Legal blindness, as defined in USA: Secondary | ICD-10-CM | POA: Diagnosis not present

## 2016-04-29 DIAGNOSIS — F419 Anxiety disorder, unspecified: Secondary | ICD-10-CM | POA: Diagnosis not present

## 2016-04-29 DIAGNOSIS — K5641 Fecal impaction: Secondary | ICD-10-CM | POA: Diagnosis not present

## 2016-04-29 DIAGNOSIS — I350 Nonrheumatic aortic (valve) stenosis: Secondary | ICD-10-CM | POA: Diagnosis not present

## 2016-04-29 DIAGNOSIS — H32 Chorioretinal disorders in diseases classified elsewhere: Secondary | ICD-10-CM | POA: Diagnosis not present

## 2016-04-29 DIAGNOSIS — D329 Benign neoplasm of meninges, unspecified: Secondary | ICD-10-CM | POA: Diagnosis not present

## 2016-04-29 DIAGNOSIS — F329 Major depressive disorder, single episode, unspecified: Secondary | ICD-10-CM | POA: Diagnosis not present

## 2016-04-29 DIAGNOSIS — M810 Age-related osteoporosis without current pathological fracture: Secondary | ICD-10-CM | POA: Diagnosis not present

## 2016-04-29 DIAGNOSIS — F0390 Unspecified dementia without behavioral disturbance: Secondary | ICD-10-CM | POA: Diagnosis not present

## 2016-04-29 DIAGNOSIS — K589 Irritable bowel syndrome without diarrhea: Secondary | ICD-10-CM | POA: Diagnosis not present

## 2016-05-06 DIAGNOSIS — K5641 Fecal impaction: Secondary | ICD-10-CM | POA: Diagnosis not present

## 2016-05-06 DIAGNOSIS — D329 Benign neoplasm of meninges, unspecified: Secondary | ICD-10-CM | POA: Diagnosis not present

## 2016-05-06 DIAGNOSIS — M150 Primary generalized (osteo)arthritis: Secondary | ICD-10-CM | POA: Diagnosis not present

## 2016-05-06 DIAGNOSIS — I350 Nonrheumatic aortic (valve) stenosis: Secondary | ICD-10-CM | POA: Diagnosis not present

## 2016-05-06 DIAGNOSIS — M810 Age-related osteoporosis without current pathological fracture: Secondary | ICD-10-CM | POA: Diagnosis not present

## 2016-05-06 DIAGNOSIS — F0390 Unspecified dementia without behavioral disturbance: Secondary | ICD-10-CM | POA: Diagnosis not present

## 2016-05-06 DIAGNOSIS — H548 Legal blindness, as defined in USA: Secondary | ICD-10-CM | POA: Diagnosis not present

## 2016-05-06 DIAGNOSIS — F329 Major depressive disorder, single episode, unspecified: Secondary | ICD-10-CM | POA: Diagnosis not present

## 2016-05-06 DIAGNOSIS — B399 Histoplasmosis, unspecified: Secondary | ICD-10-CM | POA: Diagnosis not present

## 2016-05-06 DIAGNOSIS — F419 Anxiety disorder, unspecified: Secondary | ICD-10-CM | POA: Diagnosis not present

## 2016-05-06 DIAGNOSIS — K589 Irritable bowel syndrome without diarrhea: Secondary | ICD-10-CM | POA: Diagnosis not present

## 2016-05-06 DIAGNOSIS — H32 Chorioretinal disorders in diseases classified elsewhere: Secondary | ICD-10-CM | POA: Diagnosis not present

## 2016-10-10 DIAGNOSIS — K5900 Constipation, unspecified: Secondary | ICD-10-CM | POA: Diagnosis not present

## 2016-10-10 DIAGNOSIS — R5383 Other fatigue: Secondary | ICD-10-CM | POA: Diagnosis not present

## 2016-11-18 DIAGNOSIS — M545 Low back pain: Secondary | ICD-10-CM | POA: Diagnosis not present

## 2016-11-18 DIAGNOSIS — M25579 Pain in unspecified ankle and joints of unspecified foot: Secondary | ICD-10-CM | POA: Diagnosis not present

## 2016-11-29 DIAGNOSIS — F322 Major depressive disorder, single episode, severe without psychotic features: Secondary | ICD-10-CM | POA: Diagnosis not present

## 2016-12-12 DIAGNOSIS — F419 Anxiety disorder, unspecified: Secondary | ICD-10-CM | POA: Diagnosis not present

## 2016-12-12 DIAGNOSIS — G4700 Insomnia, unspecified: Secondary | ICD-10-CM | POA: Diagnosis not present

## 2017-02-14 IMAGING — DX DG ABDOMEN 1V
1 series · 1 of 1 positions shown · non-contrast
Comparison: CT of the abdomen and pelvis from 01/29/2014

CLINICAL DATA: Acute onset of rectal pressure. Assess for fecal
impaction. Initial encounter.

EXAM:
ABDOMEN - 1 VIEW

[abdomen kub]
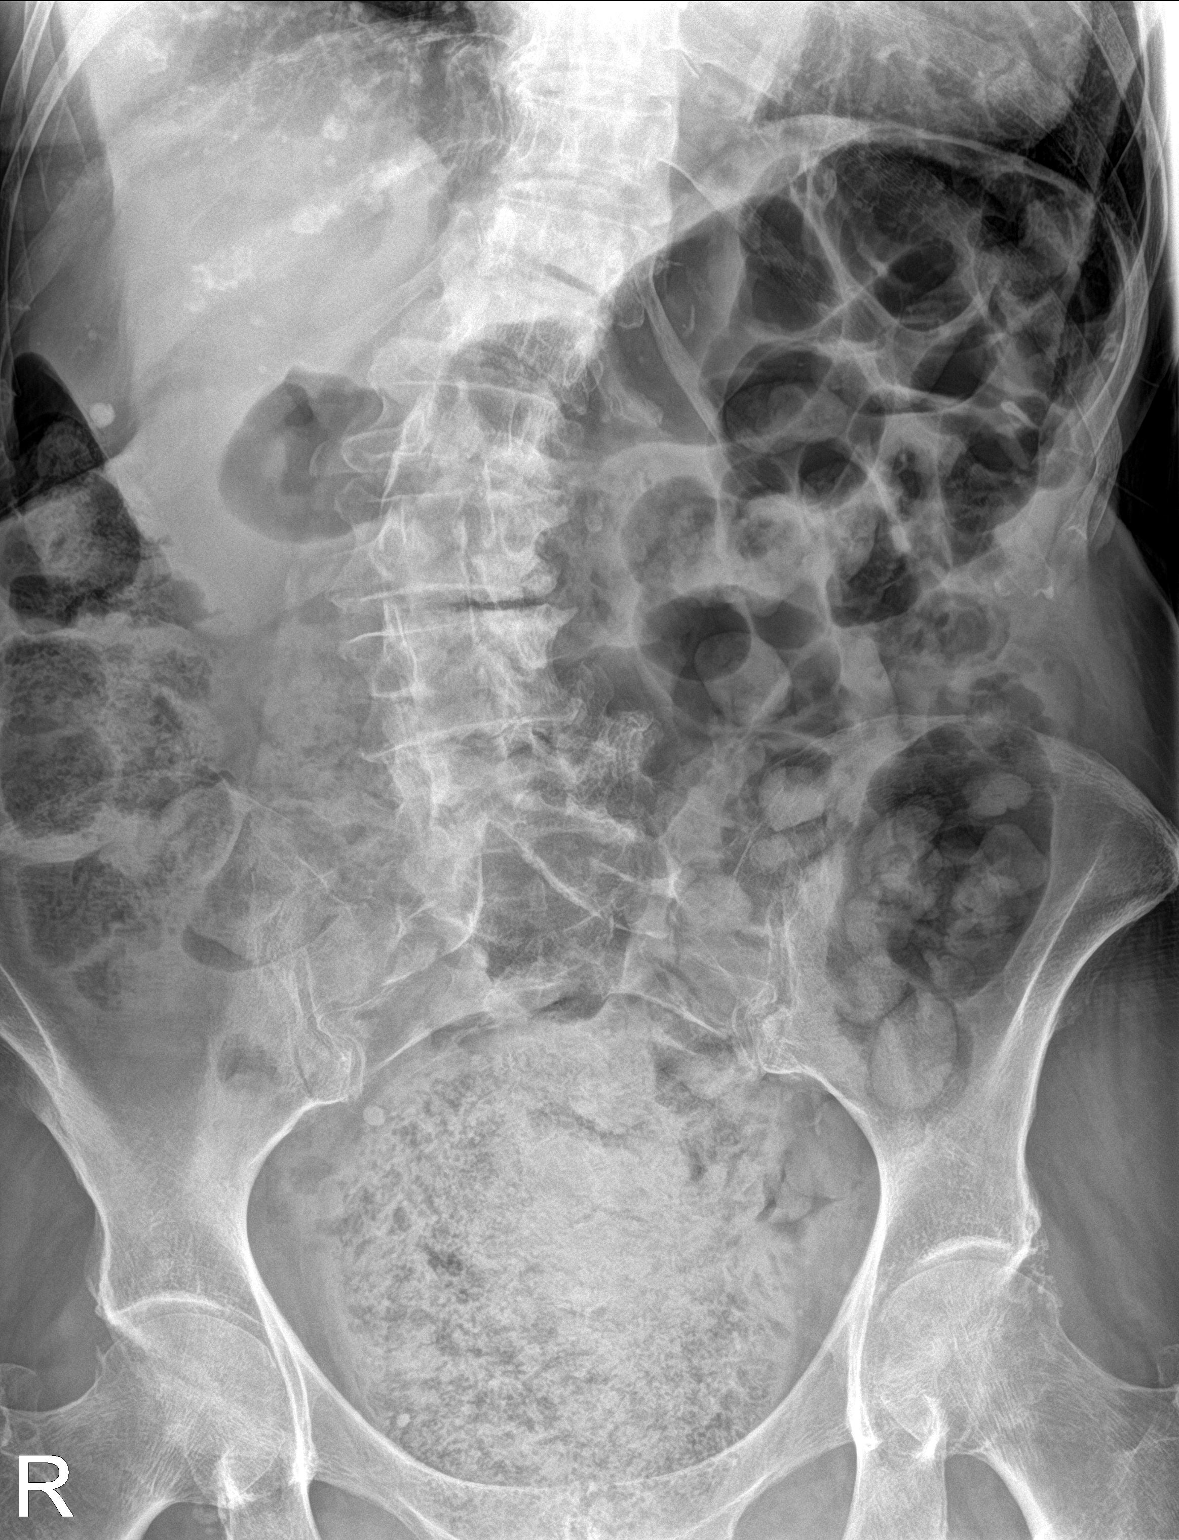

[1 of 1 positions shown; findings below may reference images not displayed]

FINDINGS: The rectum is markedly distended with dense stool, measuring 12.5 cm
in diameter. A moderate to large amount of stool noted in the
remainder of the colon. No free intra-abdominal air is seen, though
evaluation for free air is limited on a single supine view.

Mild right convex lumbar scoliosis is noted, with underlying
degenerative change.
IMPRESSION: 1. Rectum markedly distended with dense stool, measuring 12.5 cm in
diameter, compatible with significant fecal impaction.
2. Moderate to large amount of stool noted in the remainder of the
colon, reflecting constipation.
3. Mild right convex lumbar scoliosis.

## 2017-03-20 DIAGNOSIS — R609 Edema, unspecified: Secondary | ICD-10-CM | POA: Diagnosis not present

## 2017-03-20 DIAGNOSIS — F322 Major depressive disorder, single episode, severe without psychotic features: Secondary | ICD-10-CM | POA: Diagnosis not present

## 2017-03-20 DIAGNOSIS — F419 Anxiety disorder, unspecified: Secondary | ICD-10-CM | POA: Diagnosis not present

## 2017-06-19 DIAGNOSIS — R55 Syncope and collapse: Secondary | ICD-10-CM | POA: Diagnosis not present

## 2017-06-20 DIAGNOSIS — R55 Syncope and collapse: Secondary | ICD-10-CM | POA: Diagnosis not present

## 2017-07-10 DIAGNOSIS — Z5181 Encounter for therapeutic drug level monitoring: Secondary | ICD-10-CM | POA: Diagnosis not present

## 2017-07-10 DIAGNOSIS — E559 Vitamin D deficiency, unspecified: Secondary | ICD-10-CM | POA: Diagnosis not present

## 2017-07-10 DIAGNOSIS — I350 Nonrheumatic aortic (valve) stenosis: Secondary | ICD-10-CM | POA: Diagnosis not present

## 2017-07-10 DIAGNOSIS — F322 Major depressive disorder, single episode, severe without psychotic features: Secondary | ICD-10-CM | POA: Diagnosis not present

## 2017-07-10 DIAGNOSIS — S0990XA Unspecified injury of head, initial encounter: Secondary | ICD-10-CM | POA: Diagnosis not present

## 2017-07-10 DIAGNOSIS — Z13220 Encounter for screening for lipoid disorders: Secondary | ICD-10-CM | POA: Diagnosis not present

## 2017-07-12 DIAGNOSIS — H9190 Unspecified hearing loss, unspecified ear: Secondary | ICD-10-CM | POA: Diagnosis not present

## 2017-07-12 DIAGNOSIS — M810 Age-related osteoporosis without current pathological fracture: Secondary | ICD-10-CM | POA: Diagnosis not present

## 2017-07-12 DIAGNOSIS — M1990 Unspecified osteoarthritis, unspecified site: Secondary | ICD-10-CM | POA: Diagnosis not present

## 2017-07-12 DIAGNOSIS — K5790 Diverticulosis of intestine, part unspecified, without perforation or abscess without bleeding: Secondary | ICD-10-CM | POA: Diagnosis not present

## 2017-07-12 DIAGNOSIS — Z9181 History of falling: Secondary | ICD-10-CM | POA: Diagnosis not present

## 2017-07-12 DIAGNOSIS — Z85841 Personal history of malignant neoplasm of brain: Secondary | ICD-10-CM | POA: Diagnosis not present

## 2017-07-12 DIAGNOSIS — I700 Atherosclerosis of aorta: Secondary | ICD-10-CM | POA: Diagnosis not present

## 2017-07-12 DIAGNOSIS — M419 Scoliosis, unspecified: Secondary | ICD-10-CM | POA: Diagnosis not present

## 2017-07-12 DIAGNOSIS — H547 Unspecified visual loss: Secondary | ICD-10-CM | POA: Diagnosis not present

## 2017-07-12 DIAGNOSIS — I350 Nonrheumatic aortic (valve) stenosis: Secondary | ICD-10-CM | POA: Diagnosis not present

## 2017-07-12 DIAGNOSIS — G4700 Insomnia, unspecified: Secondary | ICD-10-CM | POA: Diagnosis not present

## 2017-07-12 DIAGNOSIS — F329 Major depressive disorder, single episode, unspecified: Secondary | ICD-10-CM | POA: Diagnosis not present

## 2017-07-12 DIAGNOSIS — J449 Chronic obstructive pulmonary disease, unspecified: Secondary | ICD-10-CM | POA: Diagnosis not present

## 2017-07-12 DIAGNOSIS — Z87891 Personal history of nicotine dependence: Secondary | ICD-10-CM | POA: Diagnosis not present

## 2017-07-12 DIAGNOSIS — F419 Anxiety disorder, unspecified: Secondary | ICD-10-CM | POA: Diagnosis not present

## 2017-07-12 DIAGNOSIS — K589 Irritable bowel syndrome without diarrhea: Secondary | ICD-10-CM | POA: Diagnosis not present

## 2017-08-17 DIAGNOSIS — J449 Chronic obstructive pulmonary disease, unspecified: Secondary | ICD-10-CM | POA: Diagnosis not present

## 2017-08-17 DIAGNOSIS — M189 Osteoarthritis of first carpometacarpal joint, unspecified: Secondary | ICD-10-CM | POA: Diagnosis not present

## 2017-09-11 DIAGNOSIS — I700 Atherosclerosis of aorta: Secondary | ICD-10-CM | POA: Diagnosis not present

## 2017-09-11 DIAGNOSIS — I350 Nonrheumatic aortic (valve) stenosis: Secondary | ICD-10-CM | POA: Diagnosis not present

## 2017-09-11 DIAGNOSIS — F419 Anxiety disorder, unspecified: Secondary | ICD-10-CM | POA: Diagnosis not present

## 2017-09-11 DIAGNOSIS — Z87891 Personal history of nicotine dependence: Secondary | ICD-10-CM | POA: Diagnosis not present

## 2017-09-11 DIAGNOSIS — M810 Age-related osteoporosis without current pathological fracture: Secondary | ICD-10-CM | POA: Diagnosis not present

## 2017-09-11 DIAGNOSIS — Z85841 Personal history of malignant neoplasm of brain: Secondary | ICD-10-CM | POA: Diagnosis not present

## 2017-09-11 DIAGNOSIS — G4700 Insomnia, unspecified: Secondary | ICD-10-CM | POA: Diagnosis not present

## 2017-09-11 DIAGNOSIS — H547 Unspecified visual loss: Secondary | ICD-10-CM | POA: Diagnosis not present

## 2017-09-11 DIAGNOSIS — K5790 Diverticulosis of intestine, part unspecified, without perforation or abscess without bleeding: Secondary | ICD-10-CM | POA: Diagnosis not present

## 2017-09-11 DIAGNOSIS — K589 Irritable bowel syndrome without diarrhea: Secondary | ICD-10-CM | POA: Diagnosis not present

## 2017-09-11 DIAGNOSIS — Z9181 History of falling: Secondary | ICD-10-CM | POA: Diagnosis not present

## 2017-09-11 DIAGNOSIS — J449 Chronic obstructive pulmonary disease, unspecified: Secondary | ICD-10-CM | POA: Diagnosis not present

## 2017-09-11 DIAGNOSIS — H9190 Unspecified hearing loss, unspecified ear: Secondary | ICD-10-CM | POA: Diagnosis not present

## 2017-09-11 DIAGNOSIS — M419 Scoliosis, unspecified: Secondary | ICD-10-CM | POA: Diagnosis not present

## 2017-09-11 DIAGNOSIS — M1990 Unspecified osteoarthritis, unspecified site: Secondary | ICD-10-CM | POA: Diagnosis not present

## 2017-09-11 DIAGNOSIS — F329 Major depressive disorder, single episode, unspecified: Secondary | ICD-10-CM | POA: Diagnosis not present

## 2017-10-30 DIAGNOSIS — H547 Unspecified visual loss: Secondary | ICD-10-CM | POA: Diagnosis not present

## 2017-10-30 DIAGNOSIS — J449 Chronic obstructive pulmonary disease, unspecified: Secondary | ICD-10-CM | POA: Diagnosis not present

## 2017-10-30 DIAGNOSIS — H9190 Unspecified hearing loss, unspecified ear: Secondary | ICD-10-CM | POA: Diagnosis not present

## 2017-10-30 DIAGNOSIS — Z87891 Personal history of nicotine dependence: Secondary | ICD-10-CM | POA: Diagnosis not present

## 2017-10-30 DIAGNOSIS — I350 Nonrheumatic aortic (valve) stenosis: Secondary | ICD-10-CM | POA: Diagnosis not present

## 2017-10-30 DIAGNOSIS — K5790 Diverticulosis of intestine, part unspecified, without perforation or abscess without bleeding: Secondary | ICD-10-CM | POA: Diagnosis not present

## 2017-10-30 DIAGNOSIS — M810 Age-related osteoporosis without current pathological fracture: Secondary | ICD-10-CM | POA: Diagnosis not present

## 2017-10-30 DIAGNOSIS — F329 Major depressive disorder, single episode, unspecified: Secondary | ICD-10-CM | POA: Diagnosis not present

## 2017-10-30 DIAGNOSIS — F419 Anxiety disorder, unspecified: Secondary | ICD-10-CM | POA: Diagnosis not present

## 2017-10-30 DIAGNOSIS — K589 Irritable bowel syndrome without diarrhea: Secondary | ICD-10-CM | POA: Diagnosis not present

## 2017-10-30 DIAGNOSIS — M419 Scoliosis, unspecified: Secondary | ICD-10-CM | POA: Diagnosis not present

## 2017-10-30 DIAGNOSIS — M25551 Pain in right hip: Secondary | ICD-10-CM | POA: Diagnosis not present

## 2017-10-30 DIAGNOSIS — Z85841 Personal history of malignant neoplasm of brain: Secondary | ICD-10-CM | POA: Diagnosis not present

## 2017-10-30 DIAGNOSIS — Z9181 History of falling: Secondary | ICD-10-CM | POA: Diagnosis not present

## 2017-10-30 DIAGNOSIS — M189 Osteoarthritis of first carpometacarpal joint, unspecified: Secondary | ICD-10-CM | POA: Diagnosis not present

## 2017-10-31 ENCOUNTER — Other Ambulatory Visit: Payer: Self-pay | Admitting: *Deleted

## 2017-10-31 NOTE — Patient Outreach (Addendum)
[facility]) Care Management  10/31/2017  RIYA HUXFORD 13-Aug-1923 [id number]   Telephone Screen  Referral Date: 10/25/17 Referral Source: HTA referral glenna m [facility]  Referral Reason: Son saying member needs home health services right away she is going to run out of her medications tomorrow and she is blind and needs help with medications.  Member was receiving [place] services but all visits have been used Son says she will go into withdrawals and he lives too far away to help her I have reached out to pcp and left a message with the nurse about [facility] services consent to speak also to Sussie Minor son/HIPPA [phone] Insurance: HTA   Outreach contact #2 successful to Mrs Tisdell for 10/31/17 at her home number Patient is able to verify HIPAA She states her name, dob and address to [employer] and confirms the address in [place] is to her son in [place] and addressed referral to [facility] with patient She is noted to speak slowly and needs redirection at intervals to return to the subject  She is noted to slow down her speech in conversation to assist with gathering her thoughts  Reviewed the calls to her son and Scientific laboratory technician with pending call from well care RN Discussed the successful outreach letter sent to her Son in [place]  She states her daughter, [name] is POA. She is able to verify her daughter's contact information She confirms she has 3 children Mrs Cordone gave permission for [facility] staff to speak with her sons and daughter as needed  She is very concern about her laundry care and mentions it in conversation at least 5 times  CM discussed personal care services availability from [facility] as mentioned by Theadora Rama Th patient states she is not able to afford to pay for personal care services   She states she believes she mentioned laundry and shopping to the well care RN on 10/30/17  [employer] delivered her medications on 10/30/17 per the patient It took her time to think [place]  [facility] and voiced concern abut having a bill with them The call was disconnected on Mrs Molnar end as CM was confirming the plan is to collaborate with Well care RN to determine the services needed from [facility] She voiced appreciation  Plans Pending call from [facility] RN =, CM will collaborate determine the services needed from [facility] and return a call to Mrs Crear within 7 business days  [place] L. Lavina Hamman, RN, BSN, [place] Coordinator Office number [phone] Mobile number [phone]  Main [facility] number [phone] Fax number [phone]

## 2017-10-31 NOTE — Patient Outreach (Addendum)
[facility]) Care Management  10/31/2017  Donna Webster [birth year] [id number]   Telephone Screen  Referral Date: 10/25/17 Referral Source: HTA referral Donna Webster [facility]  Referral Reason: Son saying member needs home health services right away she is going to run out of her medications tomorrow and she is blind and needs help with medications.  Member was receiving [place] services but all visits have been used Son says she will go into withdrawals and he lives too far away to help her I have reached out to pcp and left a message with the nurse about [facility] services consent to speak also to Oren Binet son/HIPPA [id number] Insurance: HTA   Outreach attempt # 1 successful to Donna Webster  Patient is able to verify HIPAA partially She can state her name, DOB, states she is at [facility] ([place], [place], [place] [address]  [phone]) for "ten years".  She is able to give her son's name and state his telephone number correctly She states but if you ask me to tell you the name of the lady I spoke to yesterday I will not be able to." She reports she does not have much time to speak with [facility] RN CM as she is getting ready to go to "dinner" and she "The problem is I am blind and move real slow" indicating she was late.  Reviewed and addressed referral to [facility] with patient briefly  [facility] RN CM informed her she was not familiar with the "lady that was here yesterday" "I need help physically cause I'Webster getting old and it is taking me time to get ready. I need someone to do my laundry and shopping . I'Webster not asking for financial assist."  She reports she has not seen fred in a few days  She requests a return call from [place] "later"   Social: Donna Webster is a blind widowed female who likes to walk and exercise She reports living alone at [place], [place], [place] [address] [phone] for "ten years" in [address]. She is reporting need of assist with all  ADLs and iADLs. She has good support from her daughter and 2 sons one in [place] and another in [place]  Conditions: COPD, blindness, osteoarthritis, vitamin D deficiency, major depression, aortic stenosis, anxiety disorder, overactive bladder, constipation, calcification of aorta, benign meningioma, insomnia, hx of AMS, acute encephalopathy, UTI,  Hyponatremia, drug reaction, protein calorie malnutrition    Medications: 4-18  pills   Appointments: Dr Darcus Austin  Advance Directives:  Consent: [facility] RN CM reviewed [facility] services with patient. Patient gave verbal consent for services.   [facility] RN CM called, left a message for her son, Donna Webster who returned a call to CM. He confirms Donna Webster has lived at [facility] for about 8-9 years in [address]. He reports services of well care home health has been started and they have assisted with Donna Webster's pills. He reports Donna Webster called him this morning but he had difficulty completely understanding everything she was telling him.   Donna Webster confirms the address listed in Epic is for the son living in [place]. Donna Webster informs CM that "all she needs now is for someone to check on her once a week and maybe to touch basis with her once a month for her pills"  He reports Donna Webster tries to stay as independent as much as possible and "will probably give you about ten  minutes of her time."   Plan: [facility] RN CM will collaborate with well care home health staff to see if there are services [facility] staff can assist with [facility] RN CM will follow back up with Donna Webster and plan for follow up or case closure depending on her needs [facility] RN CM left a message for the RN seeing Donna Webster at [facility] [phone]). Donna Webster did confirm that Donna Webster is followed for [facility] RN for medication administration assist and is scheduled to be seen 2 times a month for now The first admission services was done on 10/30/17. Well care does have a SW on board and availability to offer home  personal care services if pt needs it.  Will have RN call CM back to collaborate on plan of care needs [facility] RN CM sent a successful outreach letter as discussed with [facility] brochure enclosed for review to the address listed in Epic for son living in [place].   Donna Webster. Donna Hamman, RN, BSN, [place] Coordinator Office number [phone] Mobile number [phone]  Main [facility] number [phone] Fax number [phone]

## 2017-11-01 ENCOUNTER — Other Ambulatory Visit: Payer: Self-pay | Admitting: *Deleted

## 2017-11-01 NOTE — Patient Outreach (Signed)
[place] [facility]) Care Management  11/01/2017  AVIAN GREENAWALT 09/02/1923 [id number]   Opened in error  Joelene Millin L. Lavina Hamman, RN, BSN, [place] Coordinator Office number [phone] Mobile number [phone]  Main THN number [phone] Fax number [phone]

## 2017-11-02 ENCOUNTER — Ambulatory Visit: Payer: Self-pay | Admitting: *Deleted

## 2017-11-03 ENCOUNTER — Other Ambulatory Visit: Payer: Self-pay | Admitting: *Deleted

## 2017-11-03 NOTE — Patient Outreach (Addendum)
[facility]) Care Management  11/03/2017  Donna Webster 14-Jan-1924 [id number]  Care coordination  No response from well care RN  Attempt to collaborate again related to pt home care   Plan [facility] RN CM returned another call to wellcare spoke with Coralyn Mark at [phone] and courtney at [phone]  Called  to speak with Carlyon Shadow briefly who will return a call to [place]. Lavina Hamman, RN, BSN, [place] Coordinator Office number [phone] Mobile number [phone]  Main THN number [phone] Fax number [phone]

## 2017-11-03 NOTE — Patient Outreach (Signed)
[place] [facility]) Care Management  11/03/2017  Donna Webster 1924/01/10 [id number]   Telephone Screen  Referral Date:10/25/17 Referral Source:HTA referral glenna m [facility] Referral Reason:Son saying member needs home health services right away she is going to run out of her medications tomorrow and she is blind and needs help with medications. Member was receiving [place] services but all visits have been used Son says she will go into withdrawals and he lives too far away to help her I have reached out to pcp and left a message with the nurse about [facility] services consent to speak also to Oren Binet son/HIPPA [place] attempt # 1  unsuccessful with attempt to follow up on well care service, meds and personal care services (laundry)  No answer. [facility] RN CM left HIPAA compliant voicemail message along with CM's contact info.   Plan: [facility] RN CM will scheduled this patient for another call attempt within 4 business days [facility] RN CM did call and receive a return all from Well care RN Carlyon Shadow to report Mrs Minassian had not discussed laundry needs with her and this would be a personal care service [place] reports assisting Mrs Fullen twice month with medications (pening coverage approval) She reports [name] has a 30 day pill container that is being filled to assist with correct medicine administration. Darlene reports her son assisted with the [place] charges.  Carlyon Shadow is available fridays, saturdays and sundays to well care patients or nicolette in the office can be contacted   [place]. Lavina Hamman, RN, BSN, [place] Coordinator Office number [phone] Mobile number [phone]  Main [facility]N number [phone] Fax number [phone]

## 2017-11-03 NOTE — Patient Outreach (Signed)
[facility]) Care Management  11/03/2017  EULALAH RUPERT 1923/05/06 [id number]   Opened in error - duplicate  Mirranda Monrroy L. Lavina Hamman, RN, BSN, [place] Coordinator Office number [phone] Mobile number [phone]  Main [facility]N number [phone] Fax number [phone]

## 2017-11-06 ENCOUNTER — Emergency Department (HOSPITAL_COMMUNITY)
Admission: EM | Admit: 2017-11-06 | Discharge: 2017-11-06 | Disposition: A | Discharge status: Home / Self Care | Payer: PPO | Attending: Emergency Medicine | Admitting: Emergency Medicine

## 2017-11-06 ENCOUNTER — Other Ambulatory Visit: Payer: Self-pay

## 2017-11-06 ENCOUNTER — Emergency Department (HOSPITAL_COMMUNITY): Payer: PPO

## 2017-11-06 DIAGNOSIS — S0181XA Laceration without foreign body of other part of head, initial encounter: Secondary | ICD-10-CM

## 2017-11-06 DIAGNOSIS — Z79899 Other long term (current) drug therapy: Secondary | ICD-10-CM | POA: Insufficient documentation

## 2017-11-06 DIAGNOSIS — Z85828 Personal history of other malignant neoplasm of skin: Secondary | ICD-10-CM | POA: Insufficient documentation

## 2017-11-06 DIAGNOSIS — W19XXXA Unspecified fall, initial encounter: Secondary | ICD-10-CM | POA: Diagnosis not present

## 2017-11-06 DIAGNOSIS — Z23 Encounter for immunization: Secondary | ICD-10-CM | POA: Insufficient documentation

## 2017-11-06 DIAGNOSIS — S81012A Laceration without foreign body, left knee, initial encounter: Secondary | ICD-10-CM | POA: Insufficient documentation

## 2017-11-06 DIAGNOSIS — S0990XA Unspecified injury of head, initial encounter: Secondary | ICD-10-CM | POA: Diagnosis not present

## 2017-11-06 DIAGNOSIS — S51011A Laceration without foreign body of right elbow, initial encounter: Secondary | ICD-10-CM | POA: Insufficient documentation

## 2017-11-06 DIAGNOSIS — Y929 Unspecified place or not applicable: Secondary | ICD-10-CM | POA: Insufficient documentation

## 2017-11-06 DIAGNOSIS — S61411A Laceration without foreign body of right hand, initial encounter: Secondary | ICD-10-CM | POA: Insufficient documentation

## 2017-11-06 DIAGNOSIS — T148XXA Other injury of unspecified body region, initial encounter: Secondary | ICD-10-CM

## 2017-11-06 DIAGNOSIS — S069X9A Unspecified intracranial injury with loss of consciousness of unspecified duration, initial encounter: Secondary | ICD-10-CM | POA: Diagnosis not present

## 2017-11-06 DIAGNOSIS — R58 Hemorrhage, not elsewhere classified: Secondary | ICD-10-CM | POA: Diagnosis not present

## 2017-11-06 DIAGNOSIS — Y999 Unspecified external cause status: Secondary | ICD-10-CM | POA: Insufficient documentation

## 2017-11-06 DIAGNOSIS — S0591XA Unspecified injury of right eye and orbit, initial encounter: Secondary | ICD-10-CM | POA: Diagnosis not present

## 2017-11-06 DIAGNOSIS — W1811XA Fall from or off toilet without subsequent striking against object, initial encounter: Secondary | ICD-10-CM | POA: Insufficient documentation

## 2017-11-06 DIAGNOSIS — Y939 Activity, unspecified: Secondary | ICD-10-CM | POA: Insufficient documentation

## 2017-11-06 MED ORDER — BACITRACIN ZINC 500 UNIT/GM EX OINT
1.0000 "application " | TOPICAL_OINTMENT | Freq: Two times a day (BID) | CUTANEOUS | Status: DC
  Administered 2017-11-06: 1 via TOPICAL
  Filled 2017-11-06: qty 0.9

## 2017-11-06 MED ORDER — TETANUS-DIPHTH-ACELL PERTUSSIS 5-2.5-18.5 LF-MCG/0.5 IM SUSP
0.5000 mL | Freq: Once | INTRAMUSCULAR | Status: AC
  Administered 2017-11-06: 0.5 mL via INTRAMUSCULAR
  Filled 2017-11-06: qty 0.5

## 2017-11-06 MED ORDER — CEPHALEXIN 500 MG PO CAPS: 500.0000 mg | ORAL_CAPSULE | Freq: Two times a day (BID) | ORAL | 0 refills | Status: DC

## 2017-11-06 MED ORDER — LIDOCAINE-EPINEPHRINE 1 %-1:100000 IJ SOLN
10.0000 mL | Freq: Once | INTRAMUSCULAR | Status: AC
  Administered 2017-11-06: 10 mL
  Filled 2017-11-06: qty 10

## 2017-11-06 MED ORDER — LIDOCAINE-EPINEPHRINE-TETRACAINE (LET) SOLUTION
3.0000 mL | Freq: Once | NASAL | Status: AC
Start: 2017-11-06 — End: 2017-11-06
  Administered 2017-11-06: 11:00:00 3 mL via TOPICAL
  Filled 2017-11-06: qty 3

## 2017-11-06 NOTE — ED Triage Notes (Signed)
Patient arrives via EMS with c/o fall last night, sl;ipped off of toilet, hit right side of head, laceration noted over right eyebrow, denies blood thinners, -Loc, unwitnessed.   BP: 147/70 HR: 70 RR: 15

## 2017-11-06 NOTE — ED Provider Notes (Signed)
[facility] DEPT Provider Note   CSN: [id number] Arrival date & time: 11/06/17  1033     History   Chief Complaint Chief Complaint  Patient presents with  . Fall  . Facial Laceration    HPI Donna Webster is a 84 y.o. female.  84 year old female with past medical history including anxiety/depression, osteoarthritis, macular degeneration who presents with fall and head injury.  Last night, she was in the bathroom getting ready for bed and lost her balance and fell, striking the right side of her head on something.  She initially called for help but no one came so she finished getting ready for bed and went to sleep.  This morning she woke up and realized that something was wrong so she decided to seek medical attention.  She denies any neck pain or extremity injury. No vomiting. Unknown last tetanus.   The history is provided by the patient.  Fall     Past Medical History:  Diagnosis Date  . Anxiety   . Arthritis    osteo  . Depression   . Histoplasmosis   . History of skin cancer    facail  . Macular degeneration   . UTI (urinary tract infection) 01/29/2014    Patient Active Problem List   Diagnosis Date Noted  . Protein-calorie malnutrition, severe ([place]) 02/11/2014  . Altered mental state 02/10/2014  . Acute encephalopathy 02/10/2014  . Rash and nonspecific skin eruption 02/10/2014  . Drug reaction 02/10/2014  . UTI (lower urinary tract infection) 01/28/2014  . Altered mental status 01/28/2014  . Hyponatremia 01/28/2014  . Abnormal liver function 01/28/2014    Past Surgical History:  Procedure Laterality Date  . ANAL FISSURE REPAIR    . BREAST LUMPECTOMY Left   . COSMETIC SURGERY    . EYE SURGERY     detached retina    . sugery of female organs      1950's     OB History   None      Home Medications    Prior to Admission medications   Medication Sig Start Date End Date Taking? Authorizing Provider  ALPRAZolam  Duanne Moron) 0.5 MG tablet Take 0.5 tablets (0.25 mg total) by mouth 3 (three) times daily. Patient taking differently: Take 0.25 mg by mouth 2 (two) times daily. May take .25 mg as needed for anxiety 02/13/14  Yes Buriev, Arie Sabina, MD  mirtazapine (REMERON) 30 MG tablet Take 30 mg by mouth at bedtime.   Yes [provider]  sertraline (ZOLOFT) 50 MG tablet Take 50 mg by mouth daily.   Yes [provider]  acetaminophen (TYLENOL) 325 MG tablet Take 650 mg by mouth every 4 (four) hours as needed (pain).    [provider]  acetaminophen (TYLENOL) 500 MG tablet Take 500 mg by mouth every 6 (six) hours as needed (pain).    [provider]  cephALEXin (KEFLEX) 500 MG capsule Take 1 capsule (500 mg total) by mouth 2 (two) times daily. 11/06/17   Little, Wenda Overland, MD  docusate sodium (COLACE) 100 MG capsule Take 1 capsule (100 mg total) by mouth every 12 (twelve) hours. 03/16/16   Julianne Rice, MD  polyethylene glycol [facility] / Floria Raveling) packet Take 17 g by mouth daily. 03/16/16   Julianne Rice, MD    Family History Family History  Problem Relation Age of Onset  . Parkinsonism Father     Social History Social History   Tobacco Use  . Smoking  status: Former Research scientist (life sciences)  . Smokeless tobacco: Never Used  . Tobacco comment: qiuit smoking in 1992  Substance Use Topics  . Alcohol use: No  . Drug use: No     Allergies   Penicillins; Sulfamethoxazole; Sulfur; and Levofloxacin   Review of Systems Review of Systems  Gastrointestinal: Negative for vomiting.  Musculoskeletal: Negative for neck pain.  Skin: Positive for wound.  Neurological: Negative for syncope.  All other systems reviewed and are negative except that which was mentioned in HPI    Physical Exam Updated Vital Signs BP (!) 182/73   Pulse 65   Temp 98.2 F (36.8 C) (Oral)   Resp 16   SpO2 98%   Physical Exam  Constitutional: She is oriented to person, place, and time. She appears  well-developed and well-nourished. No distress.  HENT:  Head: Normocephalic.  Moist mucous membranes Hematoma R forehead with 2cm deep laceration R temple, no active bleeding  Eyes: Pupils are equal, round, and reactive to light. Conjunctivae are normal.  Neck: Neck supple.  No midline c-spine tenderness  Cardiovascular: Normal rate and regular rhythm.  Murmur heard. Pulmonary/Chest: Effort normal and breath sounds normal.  Abdominal: Soft. Bowel sounds are normal. She exhibits no distension. There is no tenderness.  Musculoskeletal: Normal range of motion. She exhibits no edema or tenderness.  Ecchymoses L knee, L base of thumb  Neurological: She is alert and oriented to person, place, and time.  Fluent speech  Skin: Skin is warm and dry.  Skin tears L knee, R elbow and hand  Psychiatric: She has a normal mood and affect. Judgment normal.  Nursing note and vitals reviewed.    ED Treatments / Results  Labs (all labs ordered are listed, but only abnormal results are displayed) Labs Reviewed - No data to display  EKG None  Radiology Ct Head Wo Contrast  Result Date: 11/06/2017 CLINICAL DATA:  Fall last night. Right head laceration. Loss of consciousness. EXAM: CT HEAD WITHOUT CONTRAST CT CERVICAL SPINE WITHOUT CONTRAST TECHNIQUE: Multidetector CT imaging of the head and cervical spine was performed following the standard protocol without intravenous contrast. Multiplanar CT image reconstructions of the cervical spine were also generated. COMPARISON:  MRI brain 02/11/2014.  CT head 01/28/2014. FINDINGS: CT HEAD FINDINGS Brain: There is no evidence of acute intracranial hemorrhage, mass lesion, brain edema or extra-axial fluid collection. There is mild atrophy with mild prominence of the ventricles and subarachnoid spaces. Partially calcified meningioma in the floor of the left anterior cranial fossa is again noted, measuring 2.3 x 1.8 cm on image 14/2. There is patchy low-density in  the periventricular white matter, likely due to chronic small vessel ischemic changes. There is no CT evidence of acute cortical infarction. Vascular: Intracranial vascular calcifications. No hyperdense vessel identified. Skull: Negative for fracture or focal lesion. Sinuses/Orbits: There is mild mucosal thickening in the right frontal, ethmoid and maxillary sinuses. There is a possible small air-fluid level in the right maxillary sinus. The mastoid air cells and middle ears are clear. No evidence of acute orbital hematoma. Previous lens surgery on the right. 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 Other: There is some periorbital soft tissue swelling and gas superolateral to the right orbit. Possible erosion of the right mandibular condylar head. CT CERVICAL SPINE FINDINGS Alignment: There is straightening with a mild retrolisthesis at C5-6. No focal angulation. Skull base and vertebrae: No evidence of acute fracture or traumatic subluxation. Soft tissues and spinal canal: No prevertebral  fluid or swelling. No visible canal hematoma. Disc levels: There is multilevel spondylosis with disc space narrowing and uncinate spurring. There is multilevel facet fusion, most notable on the right at C2-3 and on the left from C3 through C5. No high-grade osseous foraminal narrowing identified. Upper chest: No acute findings. Carotid atherosclerosis noted. There is mild nodularity the thyroid gland with a peripherally calcified 11 mm nodule on the left (image 61/8). Unlikely to be clinically significant. Other: None. IMPRESSION: 1. Right periorbital soft tissue injury. No evidence of acute fracture or acute intracranial process. 2. Grossly stable hemangioma arising from the left orbital roof. 3. Mild chronic small vessel ischemic changes in the periventricular white matter. 4. Multilevel cervical spondylosis without evidence of acute fracture, traumatic subluxation or static signs of  instability. Electronically Signed   By: Richardean Sale M.D.   On: 11/06/2017 11:49   Ct Cervical Spine Wo Contrast  Result Date: 11/06/2017 CLINICAL DATA:  Fall last night. Right head laceration. Loss of consciousness. EXAM: CT HEAD WITHOUT CONTRAST CT CERVICAL SPINE WITHOUT CONTRAST TECHNIQUE: Multidetector CT imaging of the head and cervical spine was performed following the standard protocol without intravenous contrast. Multiplanar CT image reconstructions of the cervical spine were also generated. COMPARISON:  MRI brain 02/11/2014.  CT head 01/28/2014. FINDINGS: CT HEAD FINDINGS Brain: There is no evidence of acute intracranial hemorrhage, mass lesion, brain edema or extra-axial fluid collection. There is mild atrophy with mild prominence of the ventricles and subarachnoid spaces. Partially calcified meningioma in the floor of the left anterior cranial fossa is again noted, measuring 2.3 x 1.8 cm on image 14/2. There is patchy low-density in the periventricular white matter, likely due to chronic small vessel ischemic changes. There is no CT evidence of acute cortical infarction. Vascular: Intracranial vascular calcifications. No hyperdense vessel identified. Skull: Negative for fracture or focal lesion. Sinuses/Orbits: There is mild mucosal thickening in the right frontal, ethmoid and maxillary sinuses. There is a possible small air-fluid level in the right maxillary sinus. The mastoid air cells and middle ears are clear. No evidence of acute orbital hematoma. Previous lens surgery on the right. 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 Other: There is some periorbital soft tissue swelling and gas superolateral to the right orbit. Possible erosion of the right mandibular condylar head. CT CERVICAL SPINE FINDINGS Alignment: There is straightening with a mild retrolisthesis at C5-6. No focal angulation. Skull base and vertebrae: No evidence of acute fracture or  traumatic subluxation. Soft tissues and spinal canal: No prevertebral fluid or swelling. No visible canal hematoma. Disc levels: There is multilevel spondylosis with disc space narrowing and uncinate spurring. There is multilevel facet fusion, most notable on the right at C2-3 and on the left from C3 through C5. No high-grade osseous foraminal narrowing identified. Upper chest: No acute findings. Carotid atherosclerosis noted. There is mild nodularity the thyroid gland with a peripherally calcified 11 mm nodule on the left (image 61/8). Unlikely to be clinically significant. Other: None. IMPRESSION: 1. Right periorbital soft tissue injury. No evidence of acute fracture or acute intracranial process. 2. Grossly stable hemangioma arising from the left orbital roof. 3. Mild chronic small vessel ischemic changes in the periventricular white matter. 4. Multilevel cervical spondylosis without evidence of acute fracture, traumatic subluxation or static signs of instability. Electronically Signed   By: Richardean Sale M.D.   On: 11/06/2017 11:49    Procedures .Marland KitchenLaceration Repair Date/Time: 11/06/2017 8:54 PM Performed by: Sharlett Iles,  MD Authorized by: Sharlett Iles, MD   Consent:    Consent obtained:  Verbal   Consent given by:  Patient   Risks discussed:  Infection, poor cosmetic result, poor wound healing and need for additional repair   Alternatives discussed:  No treatment Anesthesia (see MAR for exact dosages):    Anesthesia method:  Local infiltration   Local anesthetic:  Lidocaine 1% WITH epi Laceration details:    Location:  Face   Face location:  Forehead   Length (cm):  2 Repair type:    Repair type:  Simple Treatment:    Area cleansed with:  Betadine   Amount of cleaning:  Extensive   Irrigation solution:  Sterile saline   Irrigation volume:  500 ml   Irrigation method:  Syringe Skin repair:    Repair method:  Sutures   Suture size:  6-0   Suture material:   Fast-absorbing gut   Suture technique:  Simple interrupted   Number of sutures:  3 Approximation:    Approximation:  Loose Post-procedure details:    Dressing:  Antibiotic ointment and adhesive bandage   Patient tolerance of procedure:  Tolerated well, no immediate complications   (including critical care time)  Medications Ordered in ED Medications  Tdap (BOOSTRIX) injection 0.5 mL (0.5 mLs Intramuscular Given 11/06/17 1100)  lidocaine-EPINEPHrine-tetracaine (LET) solution (3 mLs Topical Given 11/06/17 1101)  lidocaine-EPINEPHrine (XYLOCAINE W/EPI) 1 %-1:100000 (with pres) injection 10 mL (10 mLs Other Given 11/06/17 1312)     Initial Impression / Assessment and Plan / ED Course  I have reviewed the triage vital signs and the nursing notes.  Pertinent labs & imaging results that were available during my care of the patient were reviewed by me and considered in my medical decision making (see chart for details).     Neuro intact, moving all 4 extremities. Obtained CT head and c-spine, updated tetanus.  Because her laceration is gaping, I do feel that it needs repair but I explained increased risk of infection given that it is 16 hours old. I feel that benefit of repair outweighs potential risks. Will give course of antibiotics. See proc note for repair details.  Pt remains well appearing, head and neck imaging negative. Extensively reviewed return precautions regarding head injury or signs of infection. PT voiced understanding.   Final Clinical Impressions(s) / ED Diagnoses   Final diagnoses:  Injury of head, initial encounter  Laceration of forehead, initial encounter  Multiple skin tears    ED Discharge Orders         Ordered    cephALEXin (KEFLEX) 500 MG capsule  2 times daily     11/06/17 1252           Little, Wenda Overland, MD 11/06/17 [phone]

## 2017-11-06 NOTE — Discharge Instructions (Signed)
Keep wound clean and dry. Your stitches will fall out on their own. See your primary doctor in 3-4 days for wound recheck.  You have been given antibiotics to try to prevent wound infection. Return to the ER if you have drainage, redness, swelling, or worsening pain of your wound. Return to ER if you have any vomiting, balance problems, severe headache, or arm/leg weakness.

## 2017-11-06 NOTE — ED Notes (Signed)
Bed: UX32 Expected date:  Expected time:  Means of arrival:  Comments: 84 yo f, fall, laceration to right eyebrow

## 2017-11-07 ENCOUNTER — Ambulatory Visit: Payer: Self-pay | Admitting: *Deleted

## 2017-11-08 ENCOUNTER — Other Ambulatory Visit: Payer: Self-pay | Admitting: *Deleted

## 2017-11-08 NOTE — Patient Outreach (Signed)
[facility]) Care Management  11/08/2017  Donna Webster Jul 16, 1923 [id number]   Telephone Screen/Follow up call  Referral Date:10/1817 ( CM has been following patient since 10/25/17) Referral Source:HTA referral Donna Webster  Referral Reason:Member lives at [facility] - there is no call button an no Engineer, civil (consulting). She fell in the bathroom and hit her head.  She called for help but no one came. She woke the next morning ans called EMS.  She has stitches on right temple for laceration from the fall. She is legally blind and has difficult time reading her medications. She uses [facility] who delivers her meds. Her son visits and assist with her care daily.  She states that sh can care for herself but would like assistance for Donna few hours. Insurance:HTA  And   Referral Date: 10/25/17 Referral Source: HTA referral Donna Webster  Referral Reason: Son saying member needs home health services right away she is going to run out of her medications tomorrow and she is blind and needs help with medications.  Member was receiving [place] services but all visits have been used Son says she will go into withdrawals and he lives too far away to help her I have reached out to pcp and left Donna message with the nurse about [facility] services consent to speak also to Donna Webster son/HIPPA [phone] Insurance: HTA   Since last call attempt to Donna Webster on 11/03/17 she has been to the [facility] ED on 11/06/17 for Donna fall with laceration to her right temple., and multiple skin tears. ED RN notes states she stated she slipped off of the toilet   Outreach attempt # 1 successful to the home number Patient is able to verify HIPAA but today informs [facility] RN CM "I have trouble remembering things" and CM noted memory issues when she had to be reminded of the day of the week  Reviewed and addressed referral to [facility] with patient  [facility] RN CM updated Donna Webster on the contact with Donna [facility] RN on 11/03/17  and the services she informed CM she would provide pending "approval" CM discussed her attempt to reach Donna Webster on 11/03/17  CM assessed her for transition of care and discussed her ED visit on 11/06/17  She reports remaining "sore, blind, old and slow." When the fall discussed, she informs CM she believes she "blanked out" on the toilet She has had 1 ED visit and no admissions in the last 6 months  Donna [facility] informs [facility] RN CM that Donna Webster, her son who lives closer to her, has gone to visit his sister in [place]. He also has children living in [place]. She reports he is aware of her fall and assisted in her care prior to leaving    Donna Webster reports she has had only one visit from Donna [facility] RN  Today she informs CM she will "agree to let someone else come to help me" Previously CM had inquired about referrals to [facility] and [facility] but she and Donna Webster only felt she needed someone to assist with personal care services (laundry) and Donna Webster may be able to help.   Donna Webster had informed CM on 10/31/17 that "all she needs now is for someone to check on her once Donna week and maybe to touch basis with her once Donna month for her pills"  He reports Donna Webster tries to stay as independent as much as possible and "  will probably give you about ten minutes of her time."  Donna Webster wants Donna Webster staff to be aware that she prefers to go to her dining hall to be with her friends for meals from 1200- 1330 and from 1700 to 1830 if she is feeling well She wants visits or call from 0800 to 1000 or 1400 -1600 She gets up around 7 or 8 am She asked Donna few times during the call if Donna Webster  Staff could visit her "on tomorrow"  Social: Pt has lived alone for about 10 years at Donna Webster in [address] (Donna Webster, [place], Donna Webster  [phone])  She is legally blind and has difficult time reading her medications. She uses [facility] who delivers her  meds. Her son, Donna Webster visits and assist with her care daily when available.  She states that she can care for herself but would like assistance for Donna few hours. She is reporting need of assist with all ADLs and iADLs related to being "sore, blind, old and slow." She reports she has good support from her daughter, Donna Webster (out of town)  and 2 sons one in [place]) and another in [place]) but also reports they all have medical issues and support others.  When Cm inquired about medical alert services, she reports she does not have services "I thought about getting one. I had Donna call to sign but did not." When Cm discussed if she would consider Donna higher level of care, she stated "I don't want to but I will if I have to.  I have Donna lot of friends here. How much is does that cost?" She states she is aware that Donna Webster does not have Donna higher level of care program. She states "I don't know if I can afford it.  I can afford twenty five dollars Donna month."  She confirms [place] presently "bring meals when I need them to only"  Conditions: Fall off toilet with laceration to right temple (sutures) on 11/06/17, Legally blind,  anxiety/depression, osteoarthritis, macular degeneration, arthritis, histoplasmosis, hx of facial skin cancer, UTI, COPD, blindness, osteoarthritis, vitamin D deficiency, major depression, aortic stenosis, anxiety disorder, overactive bladder, constipation, calcification of aorta, benign meningioma, insomnia, hx of AMS, acute encephalopathy, UTI,  Hyponatremia, drug reaction, protein calorie malnutrition  Medications: She is legally blind and has difficult time reading her medications. She uses [facility] who delivers her meds. Her son visits ans assist with her care daily. She denies concerns affording medications, side effects of medications and questions about medications [facility] [place] RN Darlene informed [facility] RN CM on 11/03/17 that she plans to see and assist the patient  twice Donna month with medications (pending coverage approval) She reported Donna Webster has Donna 30 day pill container that is being filled to assist with correct medicine administration. Darlene reported her son assisted with the [place] charges.  Carlyon Shadow is available fridays, saturdays and sundays to well care patients or nicolette in the office can be contacted  Appointments: Darcus Austin, primary MD an appointment needs to be made to f/u  Advance Directives: She has Donna POA, her daughter Donna [facility] Denies need for assist with or assist with changes for advance directives  DME uses walker Consent: [facility] RN CM reviewed [facility] services with patient. Patient gave verbal consent for services for [facility] RN CM and [facility]   Plan: [facility] RN CM will refer Donna Henckel to [facility]  RN CM (lives alone at independent living facility, fall hx/recent fall on 11/06/17 with laceration to right temple + skin tears, difficulties with ADLs and iADLs, blind, disease management -COPD, level of care issues, home safety) and [facility] SW for assistance with community resources, medical alert services, financial resources, questionable placement or higher level of care, support/personal care services/care giver respite for "Donna few hours" but states she may not be able to afford   [place] L. Lavina Hamman, RN, BSN, [place] Coordinator Office number [phone] Mobile number [phone]  Main [facility] number [phone] Fax number [phone]

## 2017-11-09 ENCOUNTER — Other Ambulatory Visit: Payer: Self-pay

## 2017-11-09 ENCOUNTER — Other Ambulatory Visit: Payer: Self-pay | Admitting: Licensed Clinical Social Worker

## 2017-11-09 NOTE — Patient Outreach (Signed)
[facility]) Care Management  11/09/2017  JOURNII NIERMAN 01/07/1924 [id number]   Initial outreach to Ms. Malin regarding social work referral for [employer], medical alert services, and personal care services.  Ms. Aerts reported that she could benefit from someone coming to her home 2-3 times per month to assist with light housekeeping, laundry etc.  BSW explained that her current insurance will only cover these types of services if in conjunction with a skilled service such as physical therapy. Ms. Willison inquired about the private cost of these services and BSW explained that this would be based on the agency and the needs determined after assessment.  BSW informed her that most agencies start out at approximately $20 per hour and they have a required minimum amount of hours.  BSW also talked with her about life alert systems.  She said that her apartment has a "button" that she can use in emergencies but she acknowledged that she acknowledged that she was unsure of where the button was the last time she fell. Ms. Parsley expressed interest in a life alert system as long as it is not too costly.  Ms. Ruggles requested that BSW speak with her son, Bryann Gentz, about social work referral. BSW contacted Mr. Loewe and provided him with all of the above information.  He did acknowledge that affording private services would be difficult but that his mother is above the income limit to qualify for Medicaid. He said that Ms. Moller previously had benefits through the [place] but she lost them when she remarried; she is not currently married.   Mr. Weinhold said that he intends to speak to one of the private caregivers that takes care of other residents at [facility].  He said that he is familiar with several caregivers that have been coming there for years.  He intends to inquire about their willingness to assist his mother.    He requested that [place] email a list of life alert  products. Email was sent.   Mr. Flamenco denied any other social work needs at this time so BSW is closing case.   Ronn Melena, BSW Social Worker [phone]

## 2017-11-09 NOTE — Patient Outreach (Signed)
[facility]) Care Management  11/09/2017  HENRIETTE HESSER 1923/06/22 [id number]  [facility] CSW received new referral on 11/09/17 that stated: Patient needs assistance with community resources, medical alert services, financial resources, questionable placement or higher level of care, support/personal care services/care giver respite for "a few hours" but states she may not be able to afford. [facility] CSW completed initial outreach call to patient and was able to reach her successfully. HIPPA verifications provided. [facility] CSW introduced self, reason for call and of [facility] social work services. Patient lives alone at [facility] and has been there for over a decade. Patient reports that she is very comfortable where she is currently residing and DOES NOT want LTC placement at this time but is wanting to consider aide services. Patient reports that she is mainly interested in gaining medical alert information, financial resources and personal care services. Patient admits that she has memory issues and gives verbal permission for [facility] CM to contact both her son and daughter if needed. Patient reports that she will not qualify for Adult Medicaid as her income is over $1,500 per month. Patient reports not wanting to pay out of pocket for personal care services. [facility] CSW will transfer case to [place] at this time to address patient's community resource needs as patient denies needing a higher level of placement at this time. [facility] CSW will transfer referral at this time and sign off.   Eula Fried, BSW, MSW, [email or web address] Phone: [phone] Fax: [phone]

## 2017-11-10 ENCOUNTER — Other Ambulatory Visit: Payer: Self-pay | Admitting: *Deleted

## 2017-11-10 NOTE — Patient Outreach (Addendum)
[facility]) Care Management  11/10/2017  MARYLENE MASEK 1923/03/20 [id number]   Referral received from telephonic care manager requesting home assessment due to potential need for increased level of care and recent falls.  Per chart, she has history of protein-calorie malnutrition and altered mental status.  Call placed to member, no answer.  HIPAA compliant voice message left.  Call placed to [facility], confirmed member is resident there and confirmed correct phone number for member.  Will await call back, if no call back will follow up within the next 4 business days.    Update:  Voice message received back from member.  Call placed, identity verified.  This care manager introduced self and purpose of call.  She immediately began to express how much assistance she need with ADLs/IADLs.  She denies any medical concerns at this time, but agrees to home visit/assessment (scheduled for 10/7).  Denies the need for pharmacy assistance, stating she has pharmacy at her building that manages her medications.  She is made aware that social worker has been in contact with her son and they are working on resources to provide additional support.  Will follow up with BSW and proceed with home visit next month.  Valente David, [place], MSN [place] [phone]

## 2017-11-16 DIAGNOSIS — Z23 Encounter for immunization: Secondary | ICD-10-CM | POA: Diagnosis not present

## 2017-11-16 DIAGNOSIS — S0083XA Contusion of other part of head, initial encounter: Secondary | ICD-10-CM | POA: Diagnosis not present

## 2017-11-16 DIAGNOSIS — S0181XA Laceration without foreign body of other part of head, initial encounter: Secondary | ICD-10-CM | POA: Diagnosis not present

## 2017-11-16 DIAGNOSIS — F419 Anxiety disorder, unspecified: Secondary | ICD-10-CM | POA: Diagnosis not present

## 2017-11-24 ENCOUNTER — Other Ambulatory Visit: Payer: Self-pay | Admitting: *Deleted

## 2017-11-24 NOTE — Patient Outreach (Signed)
[facility]) Care Management  11/24/2017  Donna Webster 07-Dec-1923 [id number]   Call placed to member's son, [name], to confirm member's home visit for 10/7.  He report the visit will have to be canceled as he will not be able to attend.  He expresses that his main concern is medication management and assistance with housekeeping.  Member is in need of additional support, lives in independent senior facility where medications are not managed.  He report although member is blind she is very independent and will not accept much support.  Requesting someone come over weekly to manage medications, monthly at the least.  He is advised that [facility] does not provide weekly visits just for medication management.  Per BSW notes, he has been provided with resources for private pay agencies to consider for additional support.    Son also report that he has talked to [facility] and was told that member was eligible for about 30 hours a month of services.  He report she will not accept that amount of hours as she would like to stay as independent as possible.  He would like for member to have PT, advised that PCP would have to assess member and order.  He report this has already been done.  Call placed to [facility] to follow up, no answer, will follow up next week.  Will follow up with BSW to discuss community options and follow up with son within the next 2 weeks.    Valente David, [place], MSN [place] [phone]

## 2017-11-27 ENCOUNTER — Ambulatory Visit: Payer: Self-pay | Admitting: *Deleted

## 2017-12-08 ENCOUNTER — Other Ambulatory Visit: Payer: Self-pay | Admitting: *Deleted

## 2017-12-08 NOTE — Patient Outreach (Signed)
[facility]) Care Management  12/08/2017  Donna Webster 1923-09-29 [id number]   Call placed to member's son, Donna Webster to follow up on resources to provide support for member.  He state member continues to refuse higher level of care, but she will have [facility] coming in a few times a month for oversight of medications and care of member.  He denies any further needs from [facility], agrees to case closure.  Advised to contact this care manager should needs change.  Will notify primary MD of case closure.  Valente David, [place], MSN [place] [phone]

## 2017-12-17 DIAGNOSIS — M791 Myalgia, unspecified site: Secondary | ICD-10-CM | POA: Diagnosis not present

## 2018-03-18 DIAGNOSIS — M79604 Pain in right leg: Secondary | ICD-10-CM | POA: Diagnosis not present

## 2018-03-26 DIAGNOSIS — L219 Seborrheic dermatitis, unspecified: Secondary | ICD-10-CM | POA: Diagnosis not present

## 2018-04-09 ENCOUNTER — Emergency Department (HOSPITAL_COMMUNITY): Payer: Medicare HMO

## 2018-04-09 ENCOUNTER — Emergency Department (HOSPITAL_COMMUNITY)
Admission: EM | Admit: 2018-04-09 | Discharge: 2018-04-09 | Disposition: A | Discharge status: Home / Self Care | Payer: Medicare HMO | Attending: Emergency Medicine | Admitting: Emergency Medicine

## 2018-04-09 ENCOUNTER — Other Ambulatory Visit: Payer: Self-pay

## 2018-04-09 ENCOUNTER — Encounter (HOSPITAL_COMMUNITY): Payer: Self-pay | Admitting: *Deleted

## 2018-04-09 DIAGNOSIS — R404 Transient alteration of awareness: Secondary | ICD-10-CM | POA: Diagnosis not present

## 2018-04-09 DIAGNOSIS — S3992XA Unspecified injury of lower back, initial encounter: Secondary | ICD-10-CM | POA: Diagnosis not present

## 2018-04-09 DIAGNOSIS — S0990XA Unspecified injury of head, initial encounter: Secondary | ICD-10-CM | POA: Diagnosis not present

## 2018-04-09 DIAGNOSIS — S299XXA Unspecified injury of thorax, initial encounter: Secondary | ICD-10-CM | POA: Diagnosis not present

## 2018-04-09 DIAGNOSIS — Y999 Unspecified external cause status: Secondary | ICD-10-CM | POA: Insufficient documentation

## 2018-04-09 DIAGNOSIS — R030 Elevated blood-pressure reading, without diagnosis of hypertension: Secondary | ICD-10-CM | POA: Diagnosis not present

## 2018-04-09 DIAGNOSIS — M545 Low back pain, unspecified: Secondary | ICD-10-CM

## 2018-04-09 DIAGNOSIS — Z79899 Other long term (current) drug therapy: Secondary | ICD-10-CM | POA: Diagnosis not present

## 2018-04-09 DIAGNOSIS — Y939 Activity, unspecified: Secondary | ICD-10-CM | POA: Diagnosis not present

## 2018-04-09 DIAGNOSIS — Z87891 Personal history of nicotine dependence: Secondary | ICD-10-CM | POA: Diagnosis not present

## 2018-04-09 DIAGNOSIS — W010XXA Fall on same level from slipping, tripping and stumbling without subsequent striking against object, initial encounter: Secondary | ICD-10-CM | POA: Diagnosis not present

## 2018-04-09 DIAGNOSIS — W19XXXA Unspecified fall, initial encounter: Secondary | ICD-10-CM

## 2018-04-09 DIAGNOSIS — Y92129 Unspecified place in nursing home as the place of occurrence of the external cause: Secondary | ICD-10-CM | POA: Insufficient documentation

## 2018-04-09 DIAGNOSIS — I10 Essential (primary) hypertension: Secondary | ICD-10-CM | POA: Diagnosis not present

## 2018-04-09 DIAGNOSIS — R52 Pain, unspecified: Secondary | ICD-10-CM | POA: Diagnosis not present

## 2018-04-09 DIAGNOSIS — M546 Pain in thoracic spine: Secondary | ICD-10-CM | POA: Diagnosis not present

## 2018-04-09 MED ORDER — ALPRAZOLAM 0.5 MG PO TABS: 0.5000 mg | ORAL_TABLET | Freq: Once | ORAL | Status: DC

## 2018-04-09 MED ORDER — ACETAMINOPHEN 325 MG PO TABS
650.0000 mg | ORAL_TABLET | Freq: Once | ORAL | Status: AC
  Administered 2018-04-09: 650 mg via ORAL
  Filled 2018-04-09: qty 2

## 2018-04-09 NOTE — Discharge Instructions (Addendum)
Please call your doctor for follow up. You can take 650mg  of tylenol every 6 hours as needed for pain.

## 2018-04-09 NOTE — ED Notes (Signed)
Pt able to ambulate in the hallway with the aid of a walker.  Donna Webster was anxious about getting pt home.  Discharge instructions were given to the grandson and grandson expressed understanding of instructions.  Pt was put in a wheelchair and family took pt out of department.

## 2018-04-09 NOTE — ED Provider Notes (Signed)
[facility] DEPT Provider Note   CSN: [id number] Arrival date & time: 04/09/18  1809    History   Chief Complaint Chief Complaint  Patient presents with  . Fall  . Head Injury    HPI Donna Webster is a 85 y.o. female with history of arthritis, histoplasmosis, macular degeneration presents from her retirement community for evaluation of thoracic back pain after fall.  She reports that at around 3 PM she was attempting to ambulate from her kitchen to her living room when she "moved my foot wrong" resulting in a fall in which she landed supine striking the back of her head on the floor.  She denies loss of consciousness.  She denies any headaches or vision changes but does note that she has poor vision at baseline which is unchanged.  She denies any numbness, tingling, or weakness.  No chest pain, shortness of breath, nausea, vomiting, or abdominal pain.  No prodrome leading up to the fall including lightheadedness, shortness of breath, or chest pain.  She is not currently anticoagulated.  She notes an area of aching to the thoracic spine focally which does not radiate.  Worsens with palpation and movement.  No bowel or bladder incontinence, saddle anesthesia, fevers, or IV drug use.     The history is provided by the patient.    Past Medical History:  Diagnosis Date  . Anxiety   . Arthritis    osteo  . Depression   . Histoplasmosis   . History of skin cancer    facail  . Macular degeneration   . UTI (urinary tract infection) 01/29/2014    Patient Active Problem List   Diagnosis Date Noted  . Protein-calorie malnutrition, severe ([place]) 02/11/2014  . Altered mental state 02/10/2014  . Acute encephalopathy 02/10/2014  . Rash and nonspecific skin eruption 02/10/2014  . Drug reaction 02/10/2014  . UTI (lower urinary tract infection) 01/28/2014  . Altered mental status 01/28/2014  . Hyponatremia 01/28/2014  . Abnormal liver function 01/28/2014     Past Surgical History:  Procedure Laterality Date  . ANAL FISSURE REPAIR    . BREAST LUMPECTOMY Left   . COSMETIC SURGERY    . EYE SURGERY     detached retina    . sugery of female organs      1950's     OB History   No obstetric history on file.      Home Medications    Prior to Admission medications   Medication Sig Start Date End Date Taking? Authorizing Provider  acetaminophen (TYLENOL) 500 MG tablet Take 500 mg by mouth every 6 (six) hours as needed (pain).   Yes [provider]  ALPRAZolam Duanne Moron) 0.5 MG tablet Take 0.5 tablets (0.25 mg total) by mouth 3 (three) times daily. Patient taking differently: Take 0.25 mg by mouth 2 (two) times daily. May take .25 mg as needed for anxiety 02/13/14  Yes Buriev, Arie Sabina, MD  docusate sodium (COLACE) 100 MG capsule Take 1 capsule (100 mg total) by mouth every 12 (twelve) hours. 03/16/16  Yes Julianne Rice, MD  mirtazapine (REMERON) 30 MG tablet Take 30 mg by mouth at bedtime.   Yes [provider]  sertraline (ZOLOFT) 50 MG tablet Take 50 mg by mouth every morning.    Yes [provider]  cephALEXin (KEFLEX) 500 MG capsule Take 1 capsule (500 mg total) by mouth 2 (two) times daily. Patient not taking: Reported on 04/09/2018 11/06/17   Little,  Wenda Overland, MD  polyethylene glycol [facility] / GLYCOLAX) packet Take 17 g by mouth daily. Patient not taking: Reported on 04/09/2018 03/16/16   Julianne Rice, MD    Family History Family History  Problem Relation Age of Onset  . Parkinsonism Father     Social History Social History   Tobacco Use  . Smoking status: Former Research scientist (life sciences)  . Smokeless tobacco: Never Used  . Tobacco comment: qiuit smoking in 1992  Substance Use Topics  . Alcohol use: No  . Drug use: No     Allergies   Penicillins; Sulfamethoxazole; Sulfur; and Levofloxacin   Review of Systems Review of Systems  Constitutional: Negative for chills and fever.  Respiratory: Negative  for shortness of breath.   Cardiovascular: Negative for chest pain.  Gastrointestinal: Negative for abdominal pain, nausea and vomiting.  Musculoskeletal: Positive for back pain.  Neurological: Negative for syncope, weakness, light-headedness and headaches.  All other systems reviewed and are negative.    Physical Exam Updated Vital Signs BP (!) 176/68 (BP Location: Left Arm)   Pulse 67   Temp 98 F (36.7 C) (Oral)   Resp 16   SpO2 99%   Physical Exam Vitals signs and nursing note reviewed.  Constitutional:      General: She is not in acute distress.    Appearance: She is well-developed.  HENT:     Head: Normocephalic and atraumatic.     Comments: No Battle's signs, no raccoon's eyes, no rhinorrhea. No hemotympanum. No tenderness to palpation of the face or skull. No deformity, crepitus, or swelling noted.  Eyes:     General:        Right eye: No discharge.        Left eye: No discharge.     Conjunctiva/sclera: Conjunctivae normal.     Comments: Anisocoria noted, patient reports this is chronic and unchanged   Neck:     Musculoskeletal: Normal range of motion and neck supple.     Vascular: No JVD.     Trachea: No tracheal deviation.     Comments: 2+ radial and DP/PT pulses bilaterally, Homans sign absent bilaterally, no lower extremity edema, no palpable cords, compartments are soft  Cardiovascular:     Rate and Rhythm: Normal rate.  Pulmonary:     Effort: Pulmonary effort is normal.     Breath sounds: Normal breath sounds.  Chest:     Chest wall: No tenderness.  Abdominal:     General: Abdomen is flat. There is no distension.     Tenderness: There is no abdominal tenderness. There is no guarding or rebound.  Musculoskeletal:        General: Tenderness present.     Comments: Tenderness overlying a bony protuberance along the midline thoracic spine around T7-T9.  She is somewhat kyphotic. No crepitus or stepoff noted.  5/5 strength of BUE and BLE major muscle  groups.  Pelvis appears stable.  Skin:    General: Skin is warm and dry.     Findings: No erythema.  Neurological:     Mental Status: She is alert.     Comments: Mental Status:  Alert, oriented to person, place, time, and events. Thought content appropriate, able to give a coherent history. Speech fluent without evidence of aphasia. Able to follow 2 step commands without difficulty.  Cranial Nerves:  II:  Peripheral visual fields grossly normal, pupils equal, round, reactive to light III,IV, VI: ptosis not present, extra-ocular motions intact bilaterally  V,VII: smile symmetric, facial  light touch sensation equal VIII: hearing grossly normal to voice  X: uvula elevates symmetrically  XI: bilateral shoulder shrug symmetric and strong XII: midline tongue extension without fassiculations Motor:  Normal tone. 5/5 strength of BUE and BLE major muscle groups including strong and equal grip strength and dorsiflexion/plantar flexion Sensory: light touch normal in all extremities. Gait: Ambulatory with the aid of a walker, exhibits good balance.  Psychiatric:        Behavior: Behavior normal.      ED Treatments / Results  Labs (all labs ordered are listed, but only abnormal results are displayed) Labs Reviewed - No data to display  EKG None  Radiology Dg Thoracic Spine 2 View  Result Date: 04/09/2018 CLINICAL DATA:  Fall with back pain EXAM: THORACIC SPINE 2 VIEWS COMPARISON:  Chest x-ray 02/10/2014 FINDINGS: Scoliosis of the thoracolumbar spine. Vertebral body heights are grossly maintained. There are mild degenerative changes. IMPRESSION: Scoliosis.  No definite acute osseous abnormality. Electronically Signed   By: Donavan Foil M.D.   On: 04/09/2018 20:27   Dg Lumbar Spine Complete  Result Date: 04/09/2018 CLINICAL DATA:  Fall with pain EXAM: LUMBAR SPINE - COMPLETE 4+ VIEW COMPARISON:  CT 01/29/2014 FINDINGS: Marked dextroscoliosis of the spine limits the exam. Large retained  feces at the rectum. Dense aortic atherosclerosis. Multiple level moderate severe degenerative changes. IMPRESSION: Marked scoliosis limits the exam. No gross acute osseous abnormality. Multiple level moderate severe degenerative change. Large feces retention in the rectum. Electronically Signed   By: Donavan Foil M.D.   On: 04/09/2018 20:25   Ct Head Wo Contrast  Result Date: 04/09/2018 CLINICAL DATA:  Head injury after fall. Patient hit back of head. EXAM: CT HEAD WITHOUT CONTRAST TECHNIQUE: Contiguous axial images were obtained from the base of the skull through the vertex without intravenous contrast. COMPARISON:  11/06/2017 FINDINGS: Brain: Moderate sulcal ventricular prominence, stable in appearance consistent atrophy. Chronic moderate small vessel ischemic disease of periventricular and subcortical white matter. Stable partially calcified meningioma projecting over the left frontal lobe measuring up to 1.9 cm. Idiopathic left-sided basal ganglial calcification. No acute intracranial hemorrhage, large vascular territory infarction or extra-axial fluid. Midline fourth ventricle basal cisterns. Brainstem and cerebellum are stable. Vascular: No hyperdense vessel sign. Skull: No acute skull fracture. Sinuses/Orbits: Ethmoid and bilateral maxillary sinus mucosal thickening with frothy mucous in the right maxillary sinus. Stable deformity of the left globe with calcifications suspicious for phthisis bulbi. Other: No significant calvarial soft tissue swelling. IMPRESSION: 1. Atrophy with chronic moderate small vessel ischemic disease. No acute intracranial abnormality. 2. Stable partially calcified meningioma projecting over the left frontal lobe measuring 1.9 cm. Electronically Signed   By: Ashley Royalty M.D.   On: 04/09/2018 20:05    Procedures Procedures (including critical care time)  Medications Ordered in ED Medications  acetaminophen (TYLENOL) tablet 650 mg (650 mg Oral Given 04/09/18 2229)      Initial Impression / Assessment and Plan / ED Course  I have reviewed the triage vital signs and the nursing notes.  Pertinent labs & imaging results that were available during my care of the patient were reviewed by me and considered in my medical decision making (see chart for details).        Patient presenting for evaluation of back pain after mechanical fall.  She is afebrile, somewhat hypertensive in the ED but vital signs otherwise stable.  She is nontoxic in appearance.  She is alert and oriented x4, pleasant and conversational, recalls the  events leading up to the fall.  No focal neurologic deficits, normal neurologic examination today.  No signs of serious head injury.  However, will obtain head imaging given she reports that she struck her head when she fell.  We will also obtain radiographs of the thoracic and lumbar spine given midline spine tenderness on examination.  Head CT shows atrophy with no acute intracranial abnormality.  No evidence of ICH, SAH, CVA, or skull fracture.  Radiographs show no acute osseous abnormalities noted but she does have scoliosis which could certainly contribute to pain after a fall.  She was given Tylenol in the ED with improvement in her pain.  She is ambulatory with the aid of a walker which is her baseline.  She was not using a walker when she fell.  Discussed conservative therapy with heat, ice, Tylenol, and gentle stretching.  Recommend follow-up with PCP if symptoms persist.  Discussed strict ED return precautions. Pt verbalized understanding of and agreement with plan and is safe for discharge home at this time.  Patient was seen and evaluated by Dr. Venora Maples who agrees with assessment and plan at this time.  Final Clinical Impressions(s) / ED Diagnoses   Final diagnoses:  Fall, initial encounter  Closed head injury, initial encounter  Acute low back pain without sciatica, unspecified back pain laterality    ED Discharge Orders    None        Debroah Baller 04/10/18 1255    Jola Schmidt, MD 04/11/18 0013

## 2018-04-09 NOTE — ED Notes (Signed)
Bed: MV67 Expected date:  Expected time:  Means of arrival:  Comments: 85 yo fall, head injury

## 2018-04-09 NOTE — ED Triage Notes (Signed)
Per EMS, pt from [name] here for fall and head injury. Pt states she lost footing, fell and hit back of head. Pt also complains of mid back pain. Pt has hx of scoliosis. Pt is not on blood thinners, did not lose consciousness. Pt has hx of dementia.   BP 160/80 HR 60 RR 18 SpO2 96% CBG 107

## 2018-04-26 DIAGNOSIS — E43 Unspecified severe protein-calorie malnutrition: Secondary | ICD-10-CM | POA: Diagnosis not present

## 2018-04-26 DIAGNOSIS — G3184 Mild cognitive impairment, so stated: Secondary | ICD-10-CM | POA: Diagnosis not present

## 2018-04-26 DIAGNOSIS — Z9181 History of falling: Secondary | ICD-10-CM | POA: Diagnosis not present

## 2018-04-26 DIAGNOSIS — F419 Anxiety disorder, unspecified: Secondary | ICD-10-CM | POA: Diagnosis not present

## 2018-04-26 DIAGNOSIS — M419 Scoliosis, unspecified: Secondary | ICD-10-CM | POA: Diagnosis not present

## 2018-04-26 DIAGNOSIS — M545 Low back pain: Secondary | ICD-10-CM | POA: Diagnosis not present

## 2018-04-26 DIAGNOSIS — Z85828 Personal history of other malignant neoplasm of skin: Secondary | ICD-10-CM | POA: Diagnosis not present

## 2018-04-26 DIAGNOSIS — F329 Major depressive disorder, single episode, unspecified: Secondary | ICD-10-CM | POA: Diagnosis not present

## 2018-04-26 DIAGNOSIS — M1991 Primary osteoarthritis, unspecified site: Secondary | ICD-10-CM | POA: Diagnosis not present

## 2018-04-30 DIAGNOSIS — G3184 Mild cognitive impairment, so stated: Secondary | ICD-10-CM | POA: Diagnosis not present

## 2018-04-30 DIAGNOSIS — M419 Scoliosis, unspecified: Secondary | ICD-10-CM | POA: Diagnosis not present

## 2018-04-30 DIAGNOSIS — Z9181 History of falling: Secondary | ICD-10-CM | POA: Diagnosis not present

## 2018-04-30 DIAGNOSIS — F329 Major depressive disorder, single episode, unspecified: Secondary | ICD-10-CM | POA: Diagnosis not present

## 2018-04-30 DIAGNOSIS — Z85828 Personal history of other malignant neoplasm of skin: Secondary | ICD-10-CM | POA: Diagnosis not present

## 2018-04-30 DIAGNOSIS — E43 Unspecified severe protein-calorie malnutrition: Secondary | ICD-10-CM | POA: Diagnosis not present

## 2018-04-30 DIAGNOSIS — M1991 Primary osteoarthritis, unspecified site: Secondary | ICD-10-CM | POA: Diagnosis not present

## 2018-04-30 DIAGNOSIS — M545 Low back pain: Secondary | ICD-10-CM | POA: Diagnosis not present

## 2018-04-30 DIAGNOSIS — F419 Anxiety disorder, unspecified: Secondary | ICD-10-CM | POA: Diagnosis not present

## 2018-05-02 DIAGNOSIS — M545 Low back pain: Secondary | ICD-10-CM | POA: Diagnosis not present

## 2018-05-02 DIAGNOSIS — F419 Anxiety disorder, unspecified: Secondary | ICD-10-CM | POA: Diagnosis not present

## 2018-05-02 DIAGNOSIS — M1991 Primary osteoarthritis, unspecified site: Secondary | ICD-10-CM | POA: Diagnosis not present

## 2018-05-02 DIAGNOSIS — G3184 Mild cognitive impairment, so stated: Secondary | ICD-10-CM | POA: Diagnosis not present

## 2018-05-02 DIAGNOSIS — F329 Major depressive disorder, single episode, unspecified: Secondary | ICD-10-CM | POA: Diagnosis not present

## 2018-05-02 DIAGNOSIS — E43 Unspecified severe protein-calorie malnutrition: Secondary | ICD-10-CM | POA: Diagnosis not present

## 2018-05-02 DIAGNOSIS — Z9181 History of falling: Secondary | ICD-10-CM | POA: Diagnosis not present

## 2018-05-02 DIAGNOSIS — Z85828 Personal history of other malignant neoplasm of skin: Secondary | ICD-10-CM | POA: Diagnosis not present

## 2018-05-02 DIAGNOSIS — M419 Scoliosis, unspecified: Secondary | ICD-10-CM | POA: Diagnosis not present

## 2018-05-08 DIAGNOSIS — M545 Low back pain: Secondary | ICD-10-CM | POA: Diagnosis not present

## 2018-05-08 DIAGNOSIS — Z85828 Personal history of other malignant neoplasm of skin: Secondary | ICD-10-CM | POA: Diagnosis not present

## 2018-05-08 DIAGNOSIS — M1991 Primary osteoarthritis, unspecified site: Secondary | ICD-10-CM | POA: Diagnosis not present

## 2018-05-08 DIAGNOSIS — F419 Anxiety disorder, unspecified: Secondary | ICD-10-CM | POA: Diagnosis not present

## 2018-05-08 DIAGNOSIS — E43 Unspecified severe protein-calorie malnutrition: Secondary | ICD-10-CM | POA: Diagnosis not present

## 2018-05-08 DIAGNOSIS — M419 Scoliosis, unspecified: Secondary | ICD-10-CM | POA: Diagnosis not present

## 2018-05-08 DIAGNOSIS — F329 Major depressive disorder, single episode, unspecified: Secondary | ICD-10-CM | POA: Diagnosis not present

## 2018-05-08 DIAGNOSIS — Z9181 History of falling: Secondary | ICD-10-CM | POA: Diagnosis not present

## 2018-05-08 DIAGNOSIS — G3184 Mild cognitive impairment, so stated: Secondary | ICD-10-CM | POA: Diagnosis not present

## 2018-05-09 DIAGNOSIS — F419 Anxiety disorder, unspecified: Secondary | ICD-10-CM | POA: Diagnosis not present

## 2018-05-09 DIAGNOSIS — G3184 Mild cognitive impairment, so stated: Secondary | ICD-10-CM | POA: Diagnosis not present

## 2018-05-09 DIAGNOSIS — M419 Scoliosis, unspecified: Secondary | ICD-10-CM | POA: Diagnosis not present

## 2018-05-09 DIAGNOSIS — F329 Major depressive disorder, single episode, unspecified: Secondary | ICD-10-CM | POA: Diagnosis not present

## 2018-05-09 DIAGNOSIS — Z85828 Personal history of other malignant neoplasm of skin: Secondary | ICD-10-CM | POA: Diagnosis not present

## 2018-05-09 DIAGNOSIS — M545 Low back pain: Secondary | ICD-10-CM | POA: Diagnosis not present

## 2018-05-09 DIAGNOSIS — M1991 Primary osteoarthritis, unspecified site: Secondary | ICD-10-CM | POA: Diagnosis not present

## 2018-05-09 DIAGNOSIS — E43 Unspecified severe protein-calorie malnutrition: Secondary | ICD-10-CM | POA: Diagnosis not present

## 2018-05-09 DIAGNOSIS — Z9181 History of falling: Secondary | ICD-10-CM | POA: Diagnosis not present

## 2018-05-10 DIAGNOSIS — F329 Major depressive disorder, single episode, unspecified: Secondary | ICD-10-CM | POA: Diagnosis not present

## 2018-05-10 DIAGNOSIS — Z9181 History of falling: Secondary | ICD-10-CM | POA: Diagnosis not present

## 2018-05-10 DIAGNOSIS — M419 Scoliosis, unspecified: Secondary | ICD-10-CM | POA: Diagnosis not present

## 2018-05-10 DIAGNOSIS — Z85828 Personal history of other malignant neoplasm of skin: Secondary | ICD-10-CM | POA: Diagnosis not present

## 2018-05-10 DIAGNOSIS — G3184 Mild cognitive impairment, so stated: Secondary | ICD-10-CM | POA: Diagnosis not present

## 2018-05-10 DIAGNOSIS — F419 Anxiety disorder, unspecified: Secondary | ICD-10-CM | POA: Diagnosis not present

## 2018-05-10 DIAGNOSIS — M545 Low back pain: Secondary | ICD-10-CM | POA: Diagnosis not present

## 2018-05-10 DIAGNOSIS — E43 Unspecified severe protein-calorie malnutrition: Secondary | ICD-10-CM | POA: Diagnosis not present

## 2018-05-10 DIAGNOSIS — M1991 Primary osteoarthritis, unspecified site: Secondary | ICD-10-CM | POA: Diagnosis not present

## 2018-05-14 DIAGNOSIS — M545 Low back pain: Secondary | ICD-10-CM | POA: Diagnosis not present

## 2018-05-14 DIAGNOSIS — Z9181 History of falling: Secondary | ICD-10-CM | POA: Diagnosis not present

## 2018-05-14 DIAGNOSIS — G3184 Mild cognitive impairment, so stated: Secondary | ICD-10-CM | POA: Diagnosis not present

## 2018-05-14 DIAGNOSIS — F329 Major depressive disorder, single episode, unspecified: Secondary | ICD-10-CM | POA: Diagnosis not present

## 2018-05-14 DIAGNOSIS — E43 Unspecified severe protein-calorie malnutrition: Secondary | ICD-10-CM | POA: Diagnosis not present

## 2018-05-14 DIAGNOSIS — Z85828 Personal history of other malignant neoplasm of skin: Secondary | ICD-10-CM | POA: Diagnosis not present

## 2018-05-14 DIAGNOSIS — M1991 Primary osteoarthritis, unspecified site: Secondary | ICD-10-CM | POA: Diagnosis not present

## 2018-05-14 DIAGNOSIS — M419 Scoliosis, unspecified: Secondary | ICD-10-CM | POA: Diagnosis not present

## 2018-05-14 DIAGNOSIS — F419 Anxiety disorder, unspecified: Secondary | ICD-10-CM | POA: Diagnosis not present

## 2018-05-15 DIAGNOSIS — Z9181 History of falling: Secondary | ICD-10-CM | POA: Diagnosis not present

## 2018-05-15 DIAGNOSIS — Z85828 Personal history of other malignant neoplasm of skin: Secondary | ICD-10-CM | POA: Diagnosis not present

## 2018-05-15 DIAGNOSIS — M419 Scoliosis, unspecified: Secondary | ICD-10-CM | POA: Diagnosis not present

## 2018-05-15 DIAGNOSIS — E43 Unspecified severe protein-calorie malnutrition: Secondary | ICD-10-CM | POA: Diagnosis not present

## 2018-05-15 DIAGNOSIS — M545 Low back pain: Secondary | ICD-10-CM | POA: Diagnosis not present

## 2018-05-15 DIAGNOSIS — F329 Major depressive disorder, single episode, unspecified: Secondary | ICD-10-CM | POA: Diagnosis not present

## 2018-05-15 DIAGNOSIS — M1991 Primary osteoarthritis, unspecified site: Secondary | ICD-10-CM | POA: Diagnosis not present

## 2018-05-15 DIAGNOSIS — F419 Anxiety disorder, unspecified: Secondary | ICD-10-CM | POA: Diagnosis not present

## 2018-05-15 DIAGNOSIS — G3184 Mild cognitive impairment, so stated: Secondary | ICD-10-CM | POA: Diagnosis not present

## 2018-05-17 DIAGNOSIS — M1991 Primary osteoarthritis, unspecified site: Secondary | ICD-10-CM | POA: Diagnosis not present

## 2018-05-17 DIAGNOSIS — M419 Scoliosis, unspecified: Secondary | ICD-10-CM | POA: Diagnosis not present

## 2018-05-17 DIAGNOSIS — M545 Low back pain: Secondary | ICD-10-CM | POA: Diagnosis not present

## 2018-05-17 DIAGNOSIS — F419 Anxiety disorder, unspecified: Secondary | ICD-10-CM | POA: Diagnosis not present

## 2018-05-17 DIAGNOSIS — Z85828 Personal history of other malignant neoplasm of skin: Secondary | ICD-10-CM | POA: Diagnosis not present

## 2018-05-17 DIAGNOSIS — G3184 Mild cognitive impairment, so stated: Secondary | ICD-10-CM | POA: Diagnosis not present

## 2018-05-17 DIAGNOSIS — Z9181 History of falling: Secondary | ICD-10-CM | POA: Diagnosis not present

## 2018-05-17 DIAGNOSIS — E43 Unspecified severe protein-calorie malnutrition: Secondary | ICD-10-CM | POA: Diagnosis not present

## 2018-05-17 DIAGNOSIS — F329 Major depressive disorder, single episode, unspecified: Secondary | ICD-10-CM | POA: Diagnosis not present

## 2018-05-25 DIAGNOSIS — M1991 Primary osteoarthritis, unspecified site: Secondary | ICD-10-CM | POA: Diagnosis not present

## 2018-05-25 DIAGNOSIS — F329 Major depressive disorder, single episode, unspecified: Secondary | ICD-10-CM | POA: Diagnosis not present

## 2018-05-25 DIAGNOSIS — M419 Scoliosis, unspecified: Secondary | ICD-10-CM | POA: Diagnosis not present

## 2018-05-25 DIAGNOSIS — Z9181 History of falling: Secondary | ICD-10-CM | POA: Diagnosis not present

## 2018-05-25 DIAGNOSIS — M545 Low back pain: Secondary | ICD-10-CM | POA: Diagnosis not present

## 2018-05-25 DIAGNOSIS — E43 Unspecified severe protein-calorie malnutrition: Secondary | ICD-10-CM | POA: Diagnosis not present

## 2018-05-25 DIAGNOSIS — F419 Anxiety disorder, unspecified: Secondary | ICD-10-CM | POA: Diagnosis not present

## 2018-05-25 DIAGNOSIS — Z85828 Personal history of other malignant neoplasm of skin: Secondary | ICD-10-CM | POA: Diagnosis not present

## 2018-05-25 DIAGNOSIS — G3184 Mild cognitive impairment, so stated: Secondary | ICD-10-CM | POA: Diagnosis not present

## 2018-05-26 DIAGNOSIS — F329 Major depressive disorder, single episode, unspecified: Secondary | ICD-10-CM | POA: Diagnosis not present

## 2018-05-26 DIAGNOSIS — Z85828 Personal history of other malignant neoplasm of skin: Secondary | ICD-10-CM | POA: Diagnosis not present

## 2018-05-26 DIAGNOSIS — M545 Low back pain: Secondary | ICD-10-CM | POA: Diagnosis not present

## 2018-05-26 DIAGNOSIS — M1991 Primary osteoarthritis, unspecified site: Secondary | ICD-10-CM | POA: Diagnosis not present

## 2018-05-26 DIAGNOSIS — F419 Anxiety disorder, unspecified: Secondary | ICD-10-CM | POA: Diagnosis not present

## 2018-05-26 DIAGNOSIS — G3184 Mild cognitive impairment, so stated: Secondary | ICD-10-CM | POA: Diagnosis not present

## 2018-05-26 DIAGNOSIS — E43 Unspecified severe protein-calorie malnutrition: Secondary | ICD-10-CM | POA: Diagnosis not present

## 2018-05-26 DIAGNOSIS — M419 Scoliosis, unspecified: Secondary | ICD-10-CM | POA: Diagnosis not present

## 2018-05-26 DIAGNOSIS — Z9181 History of falling: Secondary | ICD-10-CM | POA: Diagnosis not present

## 2018-05-31 DIAGNOSIS — Z9181 History of falling: Secondary | ICD-10-CM | POA: Diagnosis not present

## 2018-05-31 DIAGNOSIS — F329 Major depressive disorder, single episode, unspecified: Secondary | ICD-10-CM | POA: Diagnosis not present

## 2018-05-31 DIAGNOSIS — E43 Unspecified severe protein-calorie malnutrition: Secondary | ICD-10-CM | POA: Diagnosis not present

## 2018-05-31 DIAGNOSIS — F419 Anxiety disorder, unspecified: Secondary | ICD-10-CM | POA: Diagnosis not present

## 2018-05-31 DIAGNOSIS — M1991 Primary osteoarthritis, unspecified site: Secondary | ICD-10-CM | POA: Diagnosis not present

## 2018-05-31 DIAGNOSIS — M545 Low back pain: Secondary | ICD-10-CM | POA: Diagnosis not present

## 2018-05-31 DIAGNOSIS — Z85828 Personal history of other malignant neoplasm of skin: Secondary | ICD-10-CM | POA: Diagnosis not present

## 2018-05-31 DIAGNOSIS — M419 Scoliosis, unspecified: Secondary | ICD-10-CM | POA: Diagnosis not present

## 2018-05-31 DIAGNOSIS — G3184 Mild cognitive impairment, so stated: Secondary | ICD-10-CM | POA: Diagnosis not present

## 2018-06-02 DIAGNOSIS — Z85828 Personal history of other malignant neoplasm of skin: Secondary | ICD-10-CM | POA: Diagnosis not present

## 2018-06-02 DIAGNOSIS — Z9181 History of falling: Secondary | ICD-10-CM | POA: Diagnosis not present

## 2018-06-02 DIAGNOSIS — F329 Major depressive disorder, single episode, unspecified: Secondary | ICD-10-CM | POA: Diagnosis not present

## 2018-06-02 DIAGNOSIS — M419 Scoliosis, unspecified: Secondary | ICD-10-CM | POA: Diagnosis not present

## 2018-06-02 DIAGNOSIS — E43 Unspecified severe protein-calorie malnutrition: Secondary | ICD-10-CM | POA: Diagnosis not present

## 2018-06-02 DIAGNOSIS — M1991 Primary osteoarthritis, unspecified site: Secondary | ICD-10-CM | POA: Diagnosis not present

## 2018-06-02 DIAGNOSIS — M545 Low back pain: Secondary | ICD-10-CM | POA: Diagnosis not present

## 2018-06-02 DIAGNOSIS — F419 Anxiety disorder, unspecified: Secondary | ICD-10-CM | POA: Diagnosis not present

## 2018-06-02 DIAGNOSIS — G3184 Mild cognitive impairment, so stated: Secondary | ICD-10-CM | POA: Diagnosis not present

## 2018-06-05 DIAGNOSIS — Z85828 Personal history of other malignant neoplasm of skin: Secondary | ICD-10-CM | POA: Diagnosis not present

## 2018-06-05 DIAGNOSIS — Z9181 History of falling: Secondary | ICD-10-CM | POA: Diagnosis not present

## 2018-06-05 DIAGNOSIS — M419 Scoliosis, unspecified: Secondary | ICD-10-CM | POA: Diagnosis not present

## 2018-06-05 DIAGNOSIS — M545 Low back pain: Secondary | ICD-10-CM | POA: Diagnosis not present

## 2018-06-05 DIAGNOSIS — F329 Major depressive disorder, single episode, unspecified: Secondary | ICD-10-CM | POA: Diagnosis not present

## 2018-06-05 DIAGNOSIS — E43 Unspecified severe protein-calorie malnutrition: Secondary | ICD-10-CM | POA: Diagnosis not present

## 2018-06-05 DIAGNOSIS — M1991 Primary osteoarthritis, unspecified site: Secondary | ICD-10-CM | POA: Diagnosis not present

## 2018-06-05 DIAGNOSIS — F419 Anxiety disorder, unspecified: Secondary | ICD-10-CM | POA: Diagnosis not present

## 2018-06-05 DIAGNOSIS — G3184 Mild cognitive impairment, so stated: Secondary | ICD-10-CM | POA: Diagnosis not present

## 2018-06-06 DIAGNOSIS — F419 Anxiety disorder, unspecified: Secondary | ICD-10-CM | POA: Diagnosis not present

## 2018-06-06 DIAGNOSIS — E43 Unspecified severe protein-calorie malnutrition: Secondary | ICD-10-CM | POA: Diagnosis not present

## 2018-06-06 DIAGNOSIS — M545 Low back pain: Secondary | ICD-10-CM | POA: Diagnosis not present

## 2018-06-06 DIAGNOSIS — M1991 Primary osteoarthritis, unspecified site: Secondary | ICD-10-CM | POA: Diagnosis not present

## 2018-06-06 DIAGNOSIS — Z85828 Personal history of other malignant neoplasm of skin: Secondary | ICD-10-CM | POA: Diagnosis not present

## 2018-06-06 DIAGNOSIS — Z9181 History of falling: Secondary | ICD-10-CM | POA: Diagnosis not present

## 2018-06-06 DIAGNOSIS — F329 Major depressive disorder, single episode, unspecified: Secondary | ICD-10-CM | POA: Diagnosis not present

## 2018-06-06 DIAGNOSIS — M419 Scoliosis, unspecified: Secondary | ICD-10-CM | POA: Diagnosis not present

## 2018-06-06 DIAGNOSIS — G3184 Mild cognitive impairment, so stated: Secondary | ICD-10-CM | POA: Diagnosis not present

## 2018-06-08 DIAGNOSIS — F419 Anxiety disorder, unspecified: Secondary | ICD-10-CM | POA: Diagnosis not present

## 2018-06-08 DIAGNOSIS — G3184 Mild cognitive impairment, so stated: Secondary | ICD-10-CM | POA: Diagnosis not present

## 2018-06-08 DIAGNOSIS — M545 Low back pain: Secondary | ICD-10-CM | POA: Diagnosis not present

## 2018-06-08 DIAGNOSIS — E43 Unspecified severe protein-calorie malnutrition: Secondary | ICD-10-CM | POA: Diagnosis not present

## 2018-06-08 DIAGNOSIS — Z85828 Personal history of other malignant neoplasm of skin: Secondary | ICD-10-CM | POA: Diagnosis not present

## 2018-06-08 DIAGNOSIS — M1991 Primary osteoarthritis, unspecified site: Secondary | ICD-10-CM | POA: Diagnosis not present

## 2018-06-08 DIAGNOSIS — F329 Major depressive disorder, single episode, unspecified: Secondary | ICD-10-CM | POA: Diagnosis not present

## 2018-06-08 DIAGNOSIS — Z9181 History of falling: Secondary | ICD-10-CM | POA: Diagnosis not present

## 2018-06-08 DIAGNOSIS — M419 Scoliosis, unspecified: Secondary | ICD-10-CM | POA: Diagnosis not present

## 2018-06-12 DIAGNOSIS — M419 Scoliosis, unspecified: Secondary | ICD-10-CM | POA: Diagnosis not present

## 2018-06-12 DIAGNOSIS — Z9181 History of falling: Secondary | ICD-10-CM | POA: Diagnosis not present

## 2018-06-12 DIAGNOSIS — Z85828 Personal history of other malignant neoplasm of skin: Secondary | ICD-10-CM | POA: Diagnosis not present

## 2018-06-12 DIAGNOSIS — G3184 Mild cognitive impairment, so stated: Secondary | ICD-10-CM | POA: Diagnosis not present

## 2018-06-12 DIAGNOSIS — F329 Major depressive disorder, single episode, unspecified: Secondary | ICD-10-CM | POA: Diagnosis not present

## 2018-06-12 DIAGNOSIS — E43 Unspecified severe protein-calorie malnutrition: Secondary | ICD-10-CM | POA: Diagnosis not present

## 2018-06-12 DIAGNOSIS — M1991 Primary osteoarthritis, unspecified site: Secondary | ICD-10-CM | POA: Diagnosis not present

## 2018-06-12 DIAGNOSIS — M545 Low back pain: Secondary | ICD-10-CM | POA: Diagnosis not present

## 2018-06-12 DIAGNOSIS — F419 Anxiety disorder, unspecified: Secondary | ICD-10-CM | POA: Diagnosis not present

## 2018-06-15 DIAGNOSIS — M1991 Primary osteoarthritis, unspecified site: Secondary | ICD-10-CM | POA: Diagnosis not present

## 2018-06-15 DIAGNOSIS — M419 Scoliosis, unspecified: Secondary | ICD-10-CM | POA: Diagnosis not present

## 2018-06-15 DIAGNOSIS — M545 Low back pain: Secondary | ICD-10-CM | POA: Diagnosis not present

## 2018-06-15 DIAGNOSIS — G3184 Mild cognitive impairment, so stated: Secondary | ICD-10-CM | POA: Diagnosis not present

## 2018-06-15 DIAGNOSIS — F419 Anxiety disorder, unspecified: Secondary | ICD-10-CM | POA: Diagnosis not present

## 2018-06-15 DIAGNOSIS — E43 Unspecified severe protein-calorie malnutrition: Secondary | ICD-10-CM | POA: Diagnosis not present

## 2018-06-15 DIAGNOSIS — Z85828 Personal history of other malignant neoplasm of skin: Secondary | ICD-10-CM | POA: Diagnosis not present

## 2018-06-15 DIAGNOSIS — Z9181 History of falling: Secondary | ICD-10-CM | POA: Diagnosis not present

## 2018-06-15 DIAGNOSIS — F329 Major depressive disorder, single episode, unspecified: Secondary | ICD-10-CM | POA: Diagnosis not present

## 2018-06-19 DIAGNOSIS — M419 Scoliosis, unspecified: Secondary | ICD-10-CM | POA: Diagnosis not present

## 2018-06-19 DIAGNOSIS — M1991 Primary osteoarthritis, unspecified site: Secondary | ICD-10-CM | POA: Diagnosis not present

## 2018-06-19 DIAGNOSIS — E43 Unspecified severe protein-calorie malnutrition: Secondary | ICD-10-CM | POA: Diagnosis not present

## 2018-06-19 DIAGNOSIS — G3184 Mild cognitive impairment, so stated: Secondary | ICD-10-CM | POA: Diagnosis not present

## 2018-06-19 DIAGNOSIS — F329 Major depressive disorder, single episode, unspecified: Secondary | ICD-10-CM | POA: Diagnosis not present

## 2018-06-19 DIAGNOSIS — M545 Low back pain: Secondary | ICD-10-CM | POA: Diagnosis not present

## 2018-06-19 DIAGNOSIS — Z85828 Personal history of other malignant neoplasm of skin: Secondary | ICD-10-CM | POA: Diagnosis not present

## 2018-06-19 DIAGNOSIS — F419 Anxiety disorder, unspecified: Secondary | ICD-10-CM | POA: Diagnosis not present

## 2018-06-19 DIAGNOSIS — Z9181 History of falling: Secondary | ICD-10-CM | POA: Diagnosis not present

## 2018-06-22 DIAGNOSIS — F419 Anxiety disorder, unspecified: Secondary | ICD-10-CM | POA: Diagnosis not present

## 2018-06-22 DIAGNOSIS — F329 Major depressive disorder, single episode, unspecified: Secondary | ICD-10-CM | POA: Diagnosis not present

## 2018-06-22 DIAGNOSIS — M545 Low back pain: Secondary | ICD-10-CM | POA: Diagnosis not present

## 2018-06-22 DIAGNOSIS — M1991 Primary osteoarthritis, unspecified site: Secondary | ICD-10-CM | POA: Diagnosis not present

## 2018-06-22 DIAGNOSIS — G3184 Mild cognitive impairment, so stated: Secondary | ICD-10-CM | POA: Diagnosis not present

## 2018-06-22 DIAGNOSIS — M419 Scoliosis, unspecified: Secondary | ICD-10-CM | POA: Diagnosis not present

## 2018-06-22 DIAGNOSIS — E43 Unspecified severe protein-calorie malnutrition: Secondary | ICD-10-CM | POA: Diagnosis not present

## 2018-06-22 DIAGNOSIS — Z9181 History of falling: Secondary | ICD-10-CM | POA: Diagnosis not present

## 2018-06-22 DIAGNOSIS — Z85828 Personal history of other malignant neoplasm of skin: Secondary | ICD-10-CM | POA: Diagnosis not present

## 2018-06-26 DIAGNOSIS — M1991 Primary osteoarthritis, unspecified site: Secondary | ICD-10-CM | POA: Diagnosis not present

## 2018-06-26 DIAGNOSIS — E43 Unspecified severe protein-calorie malnutrition: Secondary | ICD-10-CM | POA: Diagnosis not present

## 2018-06-26 DIAGNOSIS — Z87891 Personal history of nicotine dependence: Secondary | ICD-10-CM | POA: Diagnosis not present

## 2018-06-26 DIAGNOSIS — H3530 Unspecified macular degeneration: Secondary | ICD-10-CM | POA: Diagnosis not present

## 2018-06-26 DIAGNOSIS — F419 Anxiety disorder, unspecified: Secondary | ICD-10-CM | POA: Diagnosis not present

## 2018-06-26 DIAGNOSIS — Z85828 Personal history of other malignant neoplasm of skin: Secondary | ICD-10-CM | POA: Diagnosis not present

## 2018-06-26 DIAGNOSIS — F329 Major depressive disorder, single episode, unspecified: Secondary | ICD-10-CM | POA: Diagnosis not present

## 2018-06-26 DIAGNOSIS — G3184 Mild cognitive impairment, so stated: Secondary | ICD-10-CM | POA: Diagnosis not present

## 2018-06-26 DIAGNOSIS — M419 Scoliosis, unspecified: Secondary | ICD-10-CM | POA: Diagnosis not present

## 2018-06-28 DIAGNOSIS — Z87891 Personal history of nicotine dependence: Secondary | ICD-10-CM | POA: Diagnosis not present

## 2018-06-28 DIAGNOSIS — Z85828 Personal history of other malignant neoplasm of skin: Secondary | ICD-10-CM | POA: Diagnosis not present

## 2018-06-28 DIAGNOSIS — E43 Unspecified severe protein-calorie malnutrition: Secondary | ICD-10-CM | POA: Diagnosis not present

## 2018-06-28 DIAGNOSIS — M419 Scoliosis, unspecified: Secondary | ICD-10-CM | POA: Diagnosis not present

## 2018-06-28 DIAGNOSIS — M1991 Primary osteoarthritis, unspecified site: Secondary | ICD-10-CM | POA: Diagnosis not present

## 2018-06-28 DIAGNOSIS — G3184 Mild cognitive impairment, so stated: Secondary | ICD-10-CM | POA: Diagnosis not present

## 2018-06-28 DIAGNOSIS — F329 Major depressive disorder, single episode, unspecified: Secondary | ICD-10-CM | POA: Diagnosis not present

## 2018-06-28 DIAGNOSIS — H3530 Unspecified macular degeneration: Secondary | ICD-10-CM | POA: Diagnosis not present

## 2018-06-28 DIAGNOSIS — F419 Anxiety disorder, unspecified: Secondary | ICD-10-CM | POA: Diagnosis not present

## 2018-07-03 DIAGNOSIS — F419 Anxiety disorder, unspecified: Secondary | ICD-10-CM | POA: Diagnosis not present

## 2018-07-03 DIAGNOSIS — E43 Unspecified severe protein-calorie malnutrition: Secondary | ICD-10-CM | POA: Diagnosis not present

## 2018-07-03 DIAGNOSIS — H3530 Unspecified macular degeneration: Secondary | ICD-10-CM | POA: Diagnosis not present

## 2018-07-03 DIAGNOSIS — M419 Scoliosis, unspecified: Secondary | ICD-10-CM | POA: Diagnosis not present

## 2018-07-03 DIAGNOSIS — M1991 Primary osteoarthritis, unspecified site: Secondary | ICD-10-CM | POA: Diagnosis not present

## 2018-07-03 DIAGNOSIS — Z87891 Personal history of nicotine dependence: Secondary | ICD-10-CM | POA: Diagnosis not present

## 2018-07-03 DIAGNOSIS — G3184 Mild cognitive impairment, so stated: Secondary | ICD-10-CM | POA: Diagnosis not present

## 2018-07-03 DIAGNOSIS — F329 Major depressive disorder, single episode, unspecified: Secondary | ICD-10-CM | POA: Diagnosis not present

## 2018-07-03 DIAGNOSIS — Z85828 Personal history of other malignant neoplasm of skin: Secondary | ICD-10-CM | POA: Diagnosis not present

## 2018-07-04 DIAGNOSIS — Z87891 Personal history of nicotine dependence: Secondary | ICD-10-CM | POA: Diagnosis not present

## 2018-07-04 DIAGNOSIS — E43 Unspecified severe protein-calorie malnutrition: Secondary | ICD-10-CM | POA: Diagnosis not present

## 2018-07-04 DIAGNOSIS — M1991 Primary osteoarthritis, unspecified site: Secondary | ICD-10-CM | POA: Diagnosis not present

## 2018-07-04 DIAGNOSIS — M419 Scoliosis, unspecified: Secondary | ICD-10-CM | POA: Diagnosis not present

## 2018-07-04 DIAGNOSIS — Z85828 Personal history of other malignant neoplasm of skin: Secondary | ICD-10-CM | POA: Diagnosis not present

## 2018-07-04 DIAGNOSIS — F329 Major depressive disorder, single episode, unspecified: Secondary | ICD-10-CM | POA: Diagnosis not present

## 2018-07-04 DIAGNOSIS — G3184 Mild cognitive impairment, so stated: Secondary | ICD-10-CM | POA: Diagnosis not present

## 2018-07-04 DIAGNOSIS — H3530 Unspecified macular degeneration: Secondary | ICD-10-CM | POA: Diagnosis not present

## 2018-07-04 DIAGNOSIS — F419 Anxiety disorder, unspecified: Secondary | ICD-10-CM | POA: Diagnosis not present

## 2018-07-10 DIAGNOSIS — Z87891 Personal history of nicotine dependence: Secondary | ICD-10-CM | POA: Diagnosis not present

## 2018-07-10 DIAGNOSIS — H3530 Unspecified macular degeneration: Secondary | ICD-10-CM | POA: Diagnosis not present

## 2018-07-10 DIAGNOSIS — M1991 Primary osteoarthritis, unspecified site: Secondary | ICD-10-CM | POA: Diagnosis not present

## 2018-07-10 DIAGNOSIS — E43 Unspecified severe protein-calorie malnutrition: Secondary | ICD-10-CM | POA: Diagnosis not present

## 2018-07-10 DIAGNOSIS — F419 Anxiety disorder, unspecified: Secondary | ICD-10-CM | POA: Diagnosis not present

## 2018-07-10 DIAGNOSIS — G3184 Mild cognitive impairment, so stated: Secondary | ICD-10-CM | POA: Diagnosis not present

## 2018-07-10 DIAGNOSIS — Z85828 Personal history of other malignant neoplasm of skin: Secondary | ICD-10-CM | POA: Diagnosis not present

## 2018-07-10 DIAGNOSIS — F329 Major depressive disorder, single episode, unspecified: Secondary | ICD-10-CM | POA: Diagnosis not present

## 2018-07-10 DIAGNOSIS — M419 Scoliosis, unspecified: Secondary | ICD-10-CM | POA: Diagnosis not present

## 2018-07-12 DIAGNOSIS — G3184 Mild cognitive impairment, so stated: Secondary | ICD-10-CM | POA: Diagnosis not present

## 2018-07-12 DIAGNOSIS — F419 Anxiety disorder, unspecified: Secondary | ICD-10-CM | POA: Diagnosis not present

## 2018-07-12 DIAGNOSIS — Z85828 Personal history of other malignant neoplasm of skin: Secondary | ICD-10-CM | POA: Diagnosis not present

## 2018-07-12 DIAGNOSIS — Z87891 Personal history of nicotine dependence: Secondary | ICD-10-CM | POA: Diagnosis not present

## 2018-07-12 DIAGNOSIS — H3530 Unspecified macular degeneration: Secondary | ICD-10-CM | POA: Diagnosis not present

## 2018-07-12 DIAGNOSIS — F329 Major depressive disorder, single episode, unspecified: Secondary | ICD-10-CM | POA: Diagnosis not present

## 2018-07-12 DIAGNOSIS — H547 Unspecified visual loss: Secondary | ICD-10-CM | POA: Diagnosis not present

## 2018-07-12 DIAGNOSIS — M419 Scoliosis, unspecified: Secondary | ICD-10-CM | POA: Diagnosis not present

## 2018-07-12 DIAGNOSIS — E43 Unspecified severe protein-calorie malnutrition: Secondary | ICD-10-CM | POA: Diagnosis not present

## 2018-07-12 DIAGNOSIS — M1991 Primary osteoarthritis, unspecified site: Secondary | ICD-10-CM | POA: Diagnosis not present

## 2018-07-17 DIAGNOSIS — F419 Anxiety disorder, unspecified: Secondary | ICD-10-CM | POA: Diagnosis not present

## 2018-07-17 DIAGNOSIS — G3184 Mild cognitive impairment, so stated: Secondary | ICD-10-CM | POA: Diagnosis not present

## 2018-07-17 DIAGNOSIS — E43 Unspecified severe protein-calorie malnutrition: Secondary | ICD-10-CM | POA: Diagnosis not present

## 2018-07-17 DIAGNOSIS — M419 Scoliosis, unspecified: Secondary | ICD-10-CM | POA: Diagnosis not present

## 2018-07-17 DIAGNOSIS — M1991 Primary osteoarthritis, unspecified site: Secondary | ICD-10-CM | POA: Diagnosis not present

## 2018-07-17 DIAGNOSIS — Z87891 Personal history of nicotine dependence: Secondary | ICD-10-CM | POA: Diagnosis not present

## 2018-07-17 DIAGNOSIS — H3530 Unspecified macular degeneration: Secondary | ICD-10-CM | POA: Diagnosis not present

## 2018-07-17 DIAGNOSIS — F329 Major depressive disorder, single episode, unspecified: Secondary | ICD-10-CM | POA: Diagnosis not present

## 2018-07-17 DIAGNOSIS — Z85828 Personal history of other malignant neoplasm of skin: Secondary | ICD-10-CM | POA: Diagnosis not present

## 2018-07-19 DIAGNOSIS — Z85828 Personal history of other malignant neoplasm of skin: Secondary | ICD-10-CM | POA: Diagnosis not present

## 2018-07-19 DIAGNOSIS — F329 Major depressive disorder, single episode, unspecified: Secondary | ICD-10-CM | POA: Diagnosis not present

## 2018-07-19 DIAGNOSIS — G3184 Mild cognitive impairment, so stated: Secondary | ICD-10-CM | POA: Diagnosis not present

## 2018-07-19 DIAGNOSIS — Z87891 Personal history of nicotine dependence: Secondary | ICD-10-CM | POA: Diagnosis not present

## 2018-07-19 DIAGNOSIS — H3530 Unspecified macular degeneration: Secondary | ICD-10-CM | POA: Diagnosis not present

## 2018-07-19 DIAGNOSIS — M1991 Primary osteoarthritis, unspecified site: Secondary | ICD-10-CM | POA: Diagnosis not present

## 2018-07-19 DIAGNOSIS — F419 Anxiety disorder, unspecified: Secondary | ICD-10-CM | POA: Diagnosis not present

## 2018-07-19 DIAGNOSIS — E43 Unspecified severe protein-calorie malnutrition: Secondary | ICD-10-CM | POA: Diagnosis not present

## 2018-07-19 DIAGNOSIS — M419 Scoliosis, unspecified: Secondary | ICD-10-CM | POA: Diagnosis not present

## 2018-07-24 DIAGNOSIS — Z87891 Personal history of nicotine dependence: Secondary | ICD-10-CM | POA: Diagnosis not present

## 2018-07-24 DIAGNOSIS — Z85828 Personal history of other malignant neoplasm of skin: Secondary | ICD-10-CM | POA: Diagnosis not present

## 2018-07-24 DIAGNOSIS — H3530 Unspecified macular degeneration: Secondary | ICD-10-CM | POA: Diagnosis not present

## 2018-07-24 DIAGNOSIS — E43 Unspecified severe protein-calorie malnutrition: Secondary | ICD-10-CM | POA: Diagnosis not present

## 2018-07-24 DIAGNOSIS — F329 Major depressive disorder, single episode, unspecified: Secondary | ICD-10-CM | POA: Diagnosis not present

## 2018-07-24 DIAGNOSIS — F419 Anxiety disorder, unspecified: Secondary | ICD-10-CM | POA: Diagnosis not present

## 2018-07-24 DIAGNOSIS — M419 Scoliosis, unspecified: Secondary | ICD-10-CM | POA: Diagnosis not present

## 2018-07-24 DIAGNOSIS — M1991 Primary osteoarthritis, unspecified site: Secondary | ICD-10-CM | POA: Diagnosis not present

## 2018-07-24 DIAGNOSIS — G3184 Mild cognitive impairment, so stated: Secondary | ICD-10-CM | POA: Diagnosis not present

## 2018-07-26 DIAGNOSIS — H3530 Unspecified macular degeneration: Secondary | ICD-10-CM | POA: Diagnosis not present

## 2018-07-26 DIAGNOSIS — M1991 Primary osteoarthritis, unspecified site: Secondary | ICD-10-CM | POA: Diagnosis not present

## 2018-07-26 DIAGNOSIS — M419 Scoliosis, unspecified: Secondary | ICD-10-CM | POA: Diagnosis not present

## 2018-07-26 DIAGNOSIS — F329 Major depressive disorder, single episode, unspecified: Secondary | ICD-10-CM | POA: Diagnosis not present

## 2018-07-26 DIAGNOSIS — E43 Unspecified severe protein-calorie malnutrition: Secondary | ICD-10-CM | POA: Diagnosis not present

## 2018-07-26 DIAGNOSIS — Z85828 Personal history of other malignant neoplasm of skin: Secondary | ICD-10-CM | POA: Diagnosis not present

## 2018-07-26 DIAGNOSIS — Z87891 Personal history of nicotine dependence: Secondary | ICD-10-CM | POA: Diagnosis not present

## 2018-07-26 DIAGNOSIS — G3184 Mild cognitive impairment, so stated: Secondary | ICD-10-CM | POA: Diagnosis not present

## 2018-07-26 DIAGNOSIS — F419 Anxiety disorder, unspecified: Secondary | ICD-10-CM | POA: Diagnosis not present

## 2018-07-31 DIAGNOSIS — E43 Unspecified severe protein-calorie malnutrition: Secondary | ICD-10-CM | POA: Diagnosis not present

## 2018-07-31 DIAGNOSIS — F329 Major depressive disorder, single episode, unspecified: Secondary | ICD-10-CM | POA: Diagnosis not present

## 2018-07-31 DIAGNOSIS — F419 Anxiety disorder, unspecified: Secondary | ICD-10-CM | POA: Diagnosis not present

## 2018-07-31 DIAGNOSIS — Z85828 Personal history of other malignant neoplasm of skin: Secondary | ICD-10-CM | POA: Diagnosis not present

## 2018-07-31 DIAGNOSIS — G3184 Mild cognitive impairment, so stated: Secondary | ICD-10-CM | POA: Diagnosis not present

## 2018-07-31 DIAGNOSIS — M1991 Primary osteoarthritis, unspecified site: Secondary | ICD-10-CM | POA: Diagnosis not present

## 2018-07-31 DIAGNOSIS — H3530 Unspecified macular degeneration: Secondary | ICD-10-CM | POA: Diagnosis not present

## 2018-07-31 DIAGNOSIS — M419 Scoliosis, unspecified: Secondary | ICD-10-CM | POA: Diagnosis not present

## 2018-07-31 DIAGNOSIS — Z87891 Personal history of nicotine dependence: Secondary | ICD-10-CM | POA: Diagnosis not present

## 2018-08-02 DIAGNOSIS — M419 Scoliosis, unspecified: Secondary | ICD-10-CM | POA: Diagnosis not present

## 2018-08-02 DIAGNOSIS — G3184 Mild cognitive impairment, so stated: Secondary | ICD-10-CM | POA: Diagnosis not present

## 2018-08-02 DIAGNOSIS — E43 Unspecified severe protein-calorie malnutrition: Secondary | ICD-10-CM | POA: Diagnosis not present

## 2018-08-02 DIAGNOSIS — F329 Major depressive disorder, single episode, unspecified: Secondary | ICD-10-CM | POA: Diagnosis not present

## 2018-08-02 DIAGNOSIS — M1991 Primary osteoarthritis, unspecified site: Secondary | ICD-10-CM | POA: Diagnosis not present

## 2018-08-02 DIAGNOSIS — Z85828 Personal history of other malignant neoplasm of skin: Secondary | ICD-10-CM | POA: Diagnosis not present

## 2018-08-02 DIAGNOSIS — H3530 Unspecified macular degeneration: Secondary | ICD-10-CM | POA: Diagnosis not present

## 2018-08-02 DIAGNOSIS — F419 Anxiety disorder, unspecified: Secondary | ICD-10-CM | POA: Diagnosis not present

## 2018-08-02 DIAGNOSIS — Z87891 Personal history of nicotine dependence: Secondary | ICD-10-CM | POA: Diagnosis not present

## 2018-08-13 DIAGNOSIS — F419 Anxiety disorder, unspecified: Secondary | ICD-10-CM | POA: Diagnosis not present

## 2018-08-13 DIAGNOSIS — K588 Other irritable bowel syndrome: Secondary | ICD-10-CM | POA: Diagnosis not present

## 2018-08-13 DIAGNOSIS — Z79899 Other long term (current) drug therapy: Secondary | ICD-10-CM | POA: Diagnosis not present

## 2018-08-13 DIAGNOSIS — F339 Major depressive disorder, recurrent, unspecified: Secondary | ICD-10-CM | POA: Diagnosis not present

## 2018-08-13 DIAGNOSIS — S81801A Unspecified open wound, right lower leg, initial encounter: Secondary | ICD-10-CM | POA: Diagnosis not present

## 2018-08-17 DIAGNOSIS — Z79899 Other long term (current) drug therapy: Secondary | ICD-10-CM | POA: Diagnosis not present

## 2018-08-17 DIAGNOSIS — M810 Age-related osteoporosis without current pathological fracture: Secondary | ICD-10-CM | POA: Diagnosis not present

## 2018-08-17 DIAGNOSIS — G3184 Mild cognitive impairment, so stated: Secondary | ICD-10-CM | POA: Diagnosis not present

## 2018-08-18 DIAGNOSIS — Z7982 Long term (current) use of aspirin: Secondary | ICD-10-CM | POA: Diagnosis not present

## 2018-08-18 DIAGNOSIS — F419 Anxiety disorder, unspecified: Secondary | ICD-10-CM | POA: Diagnosis not present

## 2018-08-18 DIAGNOSIS — F339 Major depressive disorder, recurrent, unspecified: Secondary | ICD-10-CM | POA: Diagnosis not present

## 2018-08-18 DIAGNOSIS — K588 Other irritable bowel syndrome: Secondary | ICD-10-CM | POA: Diagnosis not present

## 2018-08-18 DIAGNOSIS — S81801D Unspecified open wound, right lower leg, subsequent encounter: Secondary | ICD-10-CM | POA: Diagnosis not present

## 2018-08-18 DIAGNOSIS — Z4800 Encounter for change or removal of nonsurgical wound dressing: Secondary | ICD-10-CM | POA: Diagnosis not present

## 2018-08-22 DIAGNOSIS — F339 Major depressive disorder, recurrent, unspecified: Secondary | ICD-10-CM | POA: Diagnosis not present

## 2018-08-22 DIAGNOSIS — Z7982 Long term (current) use of aspirin: Secondary | ICD-10-CM | POA: Diagnosis not present

## 2018-08-22 DIAGNOSIS — Z4800 Encounter for change or removal of nonsurgical wound dressing: Secondary | ICD-10-CM | POA: Diagnosis not present

## 2018-08-22 DIAGNOSIS — F419 Anxiety disorder, unspecified: Secondary | ICD-10-CM | POA: Diagnosis not present

## 2018-08-22 DIAGNOSIS — K588 Other irritable bowel syndrome: Secondary | ICD-10-CM | POA: Diagnosis not present

## 2018-08-22 DIAGNOSIS — S81801D Unspecified open wound, right lower leg, subsequent encounter: Secondary | ICD-10-CM | POA: Diagnosis not present

## 2018-08-23 DIAGNOSIS — F0391 Unspecified dementia with behavioral disturbance: Secondary | ICD-10-CM | POA: Diagnosis not present

## 2018-08-23 DIAGNOSIS — F419 Anxiety disorder, unspecified: Secondary | ICD-10-CM | POA: Diagnosis not present

## 2018-08-23 DIAGNOSIS — Z79899 Other long term (current) drug therapy: Secondary | ICD-10-CM | POA: Diagnosis not present

## 2018-08-23 DIAGNOSIS — D519 Vitamin B12 deficiency anemia, unspecified: Secondary | ICD-10-CM | POA: Diagnosis not present

## 2018-08-24 DIAGNOSIS — S81801D Unspecified open wound, right lower leg, subsequent encounter: Secondary | ICD-10-CM | POA: Diagnosis not present

## 2018-08-24 DIAGNOSIS — F339 Major depressive disorder, recurrent, unspecified: Secondary | ICD-10-CM | POA: Diagnosis not present

## 2018-08-24 DIAGNOSIS — F419 Anxiety disorder, unspecified: Secondary | ICD-10-CM | POA: Diagnosis not present

## 2018-08-24 DIAGNOSIS — Z4800 Encounter for change or removal of nonsurgical wound dressing: Secondary | ICD-10-CM | POA: Diagnosis not present

## 2018-08-24 DIAGNOSIS — Z7982 Long term (current) use of aspirin: Secondary | ICD-10-CM | POA: Diagnosis not present

## 2018-08-24 DIAGNOSIS — K588 Other irritable bowel syndrome: Secondary | ICD-10-CM | POA: Diagnosis not present

## 2018-08-28 DIAGNOSIS — Z4800 Encounter for change or removal of nonsurgical wound dressing: Secondary | ICD-10-CM | POA: Diagnosis not present

## 2018-08-28 DIAGNOSIS — K588 Other irritable bowel syndrome: Secondary | ICD-10-CM | POA: Diagnosis not present

## 2018-08-28 DIAGNOSIS — Z7982 Long term (current) use of aspirin: Secondary | ICD-10-CM | POA: Diagnosis not present

## 2018-08-28 DIAGNOSIS — F339 Major depressive disorder, recurrent, unspecified: Secondary | ICD-10-CM | POA: Diagnosis not present

## 2018-08-28 DIAGNOSIS — F419 Anxiety disorder, unspecified: Secondary | ICD-10-CM | POA: Diagnosis not present

## 2018-08-28 DIAGNOSIS — S81801D Unspecified open wound, right lower leg, subsequent encounter: Secondary | ICD-10-CM | POA: Diagnosis not present

## 2018-08-31 DIAGNOSIS — S81801D Unspecified open wound, right lower leg, subsequent encounter: Secondary | ICD-10-CM | POA: Diagnosis not present

## 2018-08-31 DIAGNOSIS — K588 Other irritable bowel syndrome: Secondary | ICD-10-CM | POA: Diagnosis not present

## 2018-08-31 DIAGNOSIS — F419 Anxiety disorder, unspecified: Secondary | ICD-10-CM | POA: Diagnosis not present

## 2018-08-31 DIAGNOSIS — Z4800 Encounter for change or removal of nonsurgical wound dressing: Secondary | ICD-10-CM | POA: Diagnosis not present

## 2018-08-31 DIAGNOSIS — F339 Major depressive disorder, recurrent, unspecified: Secondary | ICD-10-CM | POA: Diagnosis not present

## 2018-08-31 DIAGNOSIS — Z7982 Long term (current) use of aspirin: Secondary | ICD-10-CM | POA: Diagnosis not present

## 2018-09-03 DIAGNOSIS — Z79899 Other long term (current) drug therapy: Secondary | ICD-10-CM | POA: Diagnosis not present

## 2018-09-03 DIAGNOSIS — F339 Major depressive disorder, recurrent, unspecified: Secondary | ICD-10-CM | POA: Diagnosis not present

## 2018-09-03 DIAGNOSIS — Z4800 Encounter for change or removal of nonsurgical wound dressing: Secondary | ICD-10-CM | POA: Diagnosis not present

## 2018-09-03 DIAGNOSIS — G301 Alzheimer's disease with late onset: Secondary | ICD-10-CM | POA: Diagnosis not present

## 2018-09-03 DIAGNOSIS — Z7982 Long term (current) use of aspirin: Secondary | ICD-10-CM | POA: Diagnosis not present

## 2018-09-03 DIAGNOSIS — F0391 Unspecified dementia with behavioral disturbance: Secondary | ICD-10-CM | POA: Diagnosis not present

## 2018-09-03 DIAGNOSIS — L8992 Pressure ulcer of unspecified site, stage 2: Secondary | ICD-10-CM | POA: Diagnosis not present

## 2018-09-03 DIAGNOSIS — E538 Deficiency of other specified B group vitamins: Secondary | ICD-10-CM | POA: Diagnosis not present

## 2018-09-03 DIAGNOSIS — K588 Other irritable bowel syndrome: Secondary | ICD-10-CM | POA: Diagnosis not present

## 2018-09-03 DIAGNOSIS — S81801D Unspecified open wound, right lower leg, subsequent encounter: Secondary | ICD-10-CM | POA: Diagnosis not present

## 2018-09-03 DIAGNOSIS — F419 Anxiety disorder, unspecified: Secondary | ICD-10-CM | POA: Diagnosis not present

## 2018-09-06 DIAGNOSIS — F0391 Unspecified dementia with behavioral disturbance: Secondary | ICD-10-CM | POA: Diagnosis not present

## 2018-09-06 DIAGNOSIS — E538 Deficiency of other specified B group vitamins: Secondary | ICD-10-CM | POA: Diagnosis not present

## 2018-09-07 DIAGNOSIS — S81801D Unspecified open wound, right lower leg, subsequent encounter: Secondary | ICD-10-CM | POA: Diagnosis not present

## 2018-09-07 DIAGNOSIS — F419 Anxiety disorder, unspecified: Secondary | ICD-10-CM | POA: Diagnosis not present

## 2018-09-07 DIAGNOSIS — Z4800 Encounter for change or removal of nonsurgical wound dressing: Secondary | ICD-10-CM | POA: Diagnosis not present

## 2018-09-07 DIAGNOSIS — F339 Major depressive disorder, recurrent, unspecified: Secondary | ICD-10-CM | POA: Diagnosis not present

## 2018-09-07 DIAGNOSIS — Z7982 Long term (current) use of aspirin: Secondary | ICD-10-CM | POA: Diagnosis not present

## 2018-09-07 DIAGNOSIS — K588 Other irritable bowel syndrome: Secondary | ICD-10-CM | POA: Diagnosis not present

## 2018-09-13 DIAGNOSIS — Z4800 Encounter for change or removal of nonsurgical wound dressing: Secondary | ICD-10-CM | POA: Diagnosis not present

## 2018-09-13 DIAGNOSIS — K588 Other irritable bowel syndrome: Secondary | ICD-10-CM | POA: Diagnosis not present

## 2018-09-13 DIAGNOSIS — Z7982 Long term (current) use of aspirin: Secondary | ICD-10-CM | POA: Diagnosis not present

## 2018-09-13 DIAGNOSIS — F339 Major depressive disorder, recurrent, unspecified: Secondary | ICD-10-CM | POA: Diagnosis not present

## 2018-09-13 DIAGNOSIS — F419 Anxiety disorder, unspecified: Secondary | ICD-10-CM | POA: Diagnosis not present

## 2018-09-13 DIAGNOSIS — S81801D Unspecified open wound, right lower leg, subsequent encounter: Secondary | ICD-10-CM | POA: Diagnosis not present

## 2018-09-14 DIAGNOSIS — S81801D Unspecified open wound, right lower leg, subsequent encounter: Secondary | ICD-10-CM | POA: Diagnosis not present

## 2018-09-14 DIAGNOSIS — Z4800 Encounter for change or removal of nonsurgical wound dressing: Secondary | ICD-10-CM | POA: Diagnosis not present

## 2018-09-14 DIAGNOSIS — F339 Major depressive disorder, recurrent, unspecified: Secondary | ICD-10-CM | POA: Diagnosis not present

## 2018-09-14 DIAGNOSIS — Z7982 Long term (current) use of aspirin: Secondary | ICD-10-CM | POA: Diagnosis not present

## 2018-09-14 DIAGNOSIS — F419 Anxiety disorder, unspecified: Secondary | ICD-10-CM | POA: Diagnosis not present

## 2018-09-14 DIAGNOSIS — K588 Other irritable bowel syndrome: Secondary | ICD-10-CM | POA: Diagnosis not present

## 2018-09-17 DIAGNOSIS — F339 Major depressive disorder, recurrent, unspecified: Secondary | ICD-10-CM | POA: Diagnosis not present

## 2018-09-17 DIAGNOSIS — S81801D Unspecified open wound, right lower leg, subsequent encounter: Secondary | ICD-10-CM | POA: Diagnosis not present

## 2018-09-17 DIAGNOSIS — R451 Restlessness and agitation: Secondary | ICD-10-CM | POA: Diagnosis not present

## 2018-09-17 DIAGNOSIS — F0391 Unspecified dementia with behavioral disturbance: Secondary | ICD-10-CM | POA: Diagnosis not present

## 2018-09-17 DIAGNOSIS — Z7982 Long term (current) use of aspirin: Secondary | ICD-10-CM | POA: Diagnosis not present

## 2018-09-17 DIAGNOSIS — F419 Anxiety disorder, unspecified: Secondary | ICD-10-CM | POA: Diagnosis not present

## 2018-09-17 DIAGNOSIS — K588 Other irritable bowel syndrome: Secondary | ICD-10-CM | POA: Diagnosis not present

## 2018-09-17 DIAGNOSIS — Z4800 Encounter for change or removal of nonsurgical wound dressing: Secondary | ICD-10-CM | POA: Diagnosis not present

## 2018-09-18 DIAGNOSIS — F419 Anxiety disorder, unspecified: Secondary | ICD-10-CM | POA: Diagnosis not present

## 2018-09-18 DIAGNOSIS — I509 Heart failure, unspecified: Secondary | ICD-10-CM | POA: Diagnosis not present

## 2018-09-18 DIAGNOSIS — R600 Localized edema: Secondary | ICD-10-CM | POA: Diagnosis not present

## 2018-09-18 DIAGNOSIS — F0391 Unspecified dementia with behavioral disturbance: Secondary | ICD-10-CM | POA: Diagnosis not present

## 2018-09-21 DIAGNOSIS — Z7982 Long term (current) use of aspirin: Secondary | ICD-10-CM | POA: Diagnosis not present

## 2018-09-21 DIAGNOSIS — Z4800 Encounter for change or removal of nonsurgical wound dressing: Secondary | ICD-10-CM | POA: Diagnosis not present

## 2018-09-21 DIAGNOSIS — F339 Major depressive disorder, recurrent, unspecified: Secondary | ICD-10-CM | POA: Diagnosis not present

## 2018-09-21 DIAGNOSIS — F419 Anxiety disorder, unspecified: Secondary | ICD-10-CM | POA: Diagnosis not present

## 2018-09-21 DIAGNOSIS — S81801D Unspecified open wound, right lower leg, subsequent encounter: Secondary | ICD-10-CM | POA: Diagnosis not present

## 2018-09-21 DIAGNOSIS — K588 Other irritable bowel syndrome: Secondary | ICD-10-CM | POA: Diagnosis not present

## 2018-09-24 DIAGNOSIS — S81801D Unspecified open wound, right lower leg, subsequent encounter: Secondary | ICD-10-CM | POA: Diagnosis not present

## 2018-09-24 DIAGNOSIS — Z7982 Long term (current) use of aspirin: Secondary | ICD-10-CM | POA: Diagnosis not present

## 2018-09-24 DIAGNOSIS — F419 Anxiety disorder, unspecified: Secondary | ICD-10-CM | POA: Diagnosis not present

## 2018-09-24 DIAGNOSIS — K588 Other irritable bowel syndrome: Secondary | ICD-10-CM | POA: Diagnosis not present

## 2018-09-24 DIAGNOSIS — Z4800 Encounter for change or removal of nonsurgical wound dressing: Secondary | ICD-10-CM | POA: Diagnosis not present

## 2018-09-24 DIAGNOSIS — F339 Major depressive disorder, recurrent, unspecified: Secondary | ICD-10-CM | POA: Diagnosis not present

## 2018-09-25 DIAGNOSIS — G301 Alzheimer's disease with late onset: Secondary | ICD-10-CM | POA: Diagnosis not present

## 2018-09-25 DIAGNOSIS — F419 Anxiety disorder, unspecified: Secondary | ICD-10-CM | POA: Diagnosis not present

## 2018-09-25 DIAGNOSIS — I509 Heart failure, unspecified: Secondary | ICD-10-CM | POA: Diagnosis not present

## 2018-09-25 DIAGNOSIS — F0391 Unspecified dementia with behavioral disturbance: Secondary | ICD-10-CM | POA: Diagnosis not present

## 2018-09-28 DIAGNOSIS — F339 Major depressive disorder, recurrent, unspecified: Secondary | ICD-10-CM | POA: Diagnosis not present

## 2018-09-28 DIAGNOSIS — Z7982 Long term (current) use of aspirin: Secondary | ICD-10-CM | POA: Diagnosis not present

## 2018-09-28 DIAGNOSIS — F419 Anxiety disorder, unspecified: Secondary | ICD-10-CM | POA: Diagnosis not present

## 2018-09-28 DIAGNOSIS — S81801D Unspecified open wound, right lower leg, subsequent encounter: Secondary | ICD-10-CM | POA: Diagnosis not present

## 2018-09-28 DIAGNOSIS — Z4800 Encounter for change or removal of nonsurgical wound dressing: Secondary | ICD-10-CM | POA: Diagnosis not present

## 2018-09-28 DIAGNOSIS — K588 Other irritable bowel syndrome: Secondary | ICD-10-CM | POA: Diagnosis not present

## 2018-10-02 DIAGNOSIS — S81801D Unspecified open wound, right lower leg, subsequent encounter: Secondary | ICD-10-CM | POA: Diagnosis not present

## 2018-10-02 DIAGNOSIS — Z4800 Encounter for change or removal of nonsurgical wound dressing: Secondary | ICD-10-CM | POA: Diagnosis not present

## 2018-10-02 DIAGNOSIS — Z7982 Long term (current) use of aspirin: Secondary | ICD-10-CM | POA: Diagnosis not present

## 2018-10-02 DIAGNOSIS — F0391 Unspecified dementia with behavioral disturbance: Secondary | ICD-10-CM | POA: Diagnosis not present

## 2018-10-02 DIAGNOSIS — F419 Anxiety disorder, unspecified: Secondary | ICD-10-CM | POA: Diagnosis not present

## 2018-10-02 DIAGNOSIS — F339 Major depressive disorder, recurrent, unspecified: Secondary | ICD-10-CM | POA: Diagnosis not present

## 2018-10-02 DIAGNOSIS — K588 Other irritable bowel syndrome: Secondary | ICD-10-CM | POA: Diagnosis not present

## 2018-10-04 DIAGNOSIS — Z7982 Long term (current) use of aspirin: Secondary | ICD-10-CM | POA: Diagnosis not present

## 2018-10-04 DIAGNOSIS — K588 Other irritable bowel syndrome: Secondary | ICD-10-CM | POA: Diagnosis not present

## 2018-10-04 DIAGNOSIS — Z4800 Encounter for change or removal of nonsurgical wound dressing: Secondary | ICD-10-CM | POA: Diagnosis not present

## 2018-10-04 DIAGNOSIS — F419 Anxiety disorder, unspecified: Secondary | ICD-10-CM | POA: Diagnosis not present

## 2018-10-04 DIAGNOSIS — F339 Major depressive disorder, recurrent, unspecified: Secondary | ICD-10-CM | POA: Diagnosis not present

## 2018-10-04 DIAGNOSIS — S81801D Unspecified open wound, right lower leg, subsequent encounter: Secondary | ICD-10-CM | POA: Diagnosis not present

## 2018-10-07 IMAGING — CT CT CERVICAL SPINE W/O CM
4 of 7 series · 13 of 33 positions shown, 15 images · non-contrast
Comparison: MRI brain 02/11/2014.  CT head 01/28/2014.

CLINICAL DATA: Fall last night. Right head laceration. Loss of
consciousness.

EXAM:
CT HEAD WITHOUT CONTRAST
CT CERVICAL SPINE WITHOUT CONTRAST
TECHNIQUE: Multidetector CT imaging of the head and cervical spine was
performed following the standard protocol without intravenous
contrast. Multiplanar CT image reconstructions of the cervical spine
were also generated.

[Series 4: coronal soft tissue · coronal · 0.40mm/px · 2 of 67 slices shown]
[im 23/67  bone]
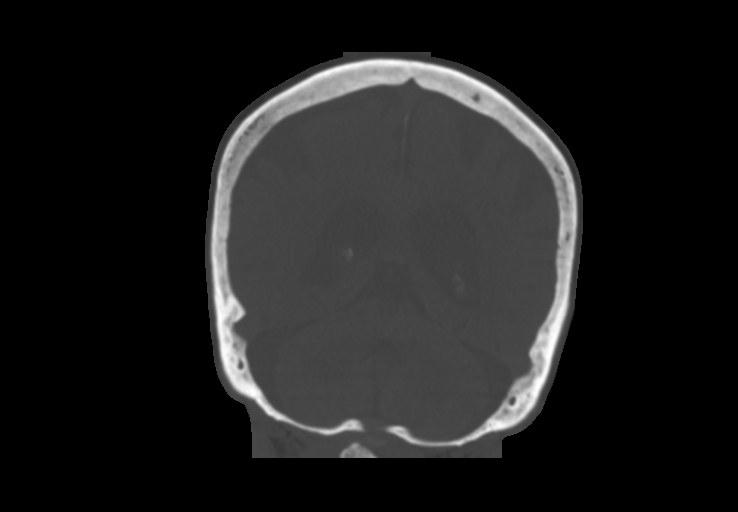
[im 45/67  bone]
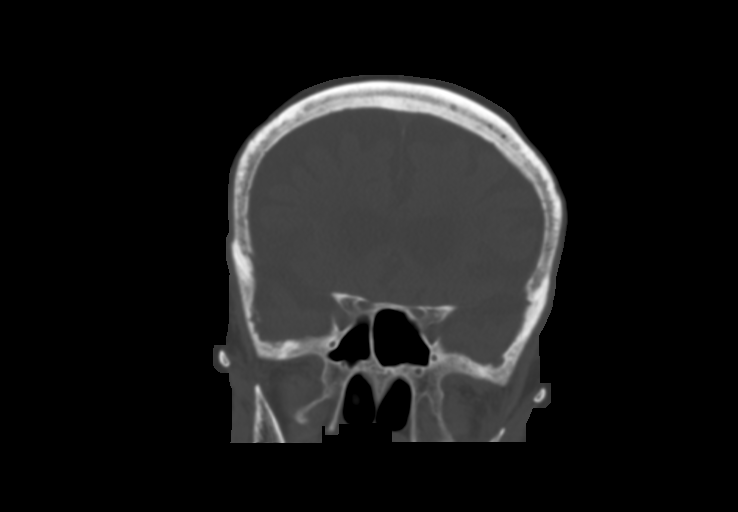

[Series 8: c spine soft · axial · 0.29mm/px · z∈[+1607,+1643]mm · 2 of 72 slices shown]
[im 18/72  soft-tissue]
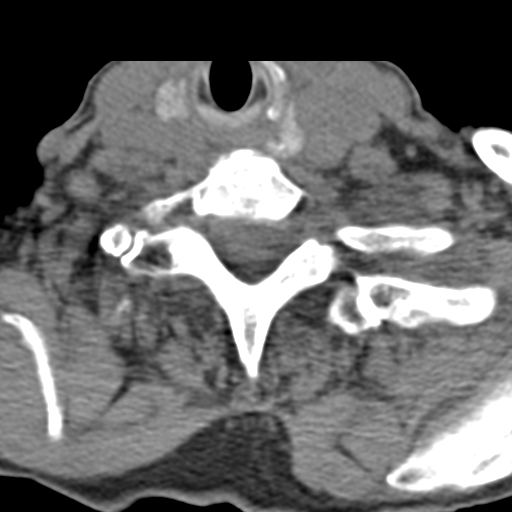
[im 36/72  soft-tissue]
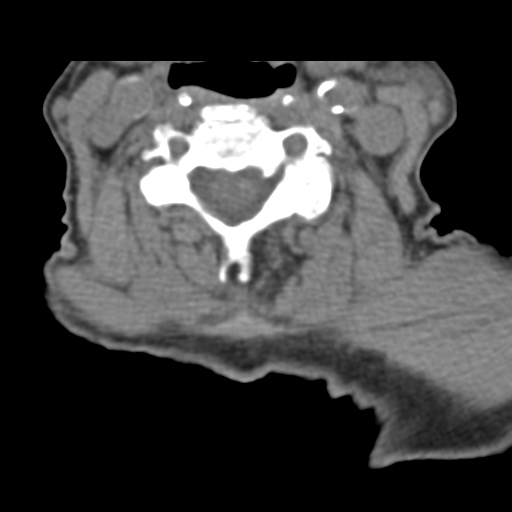

[Series 9: orthogonal bone · axial · 0.29mm/px · z∈[+1567,+1671]mm · 5 of 92 slices shown, 7 images]
[im 16/92  soft-tissue]
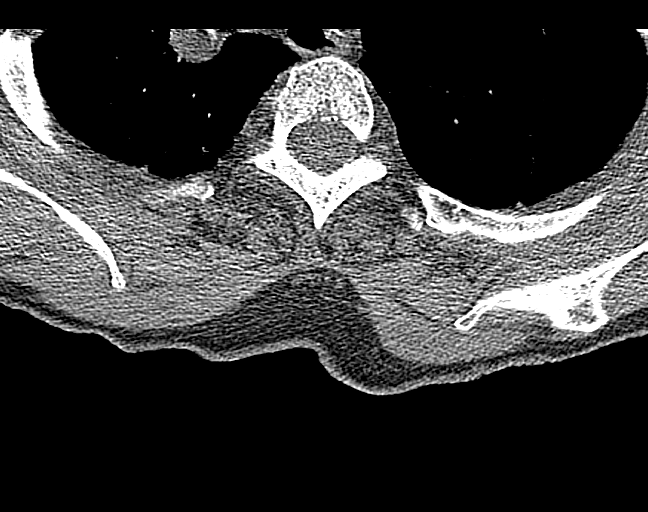
[im 16/92  bone]
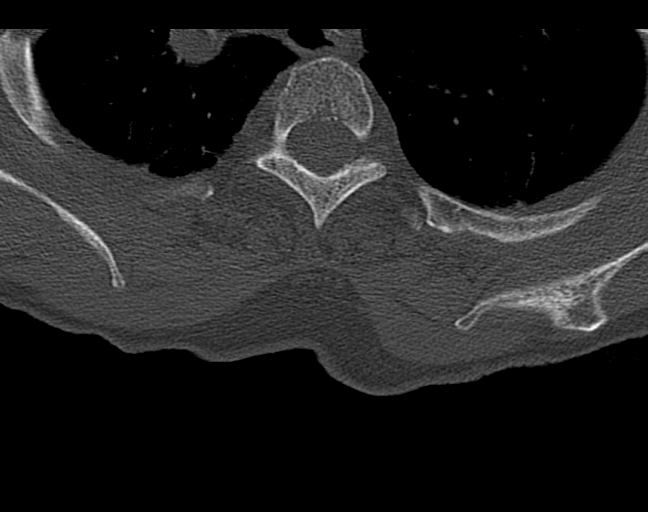
[im 31/92  bone]
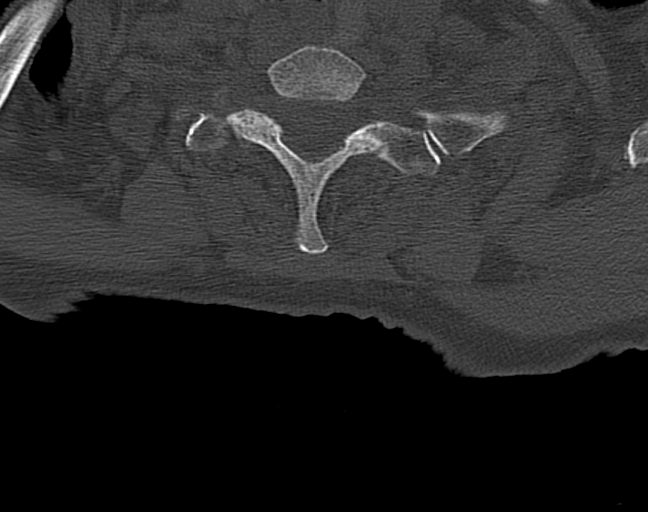
[im 46/92  bone]
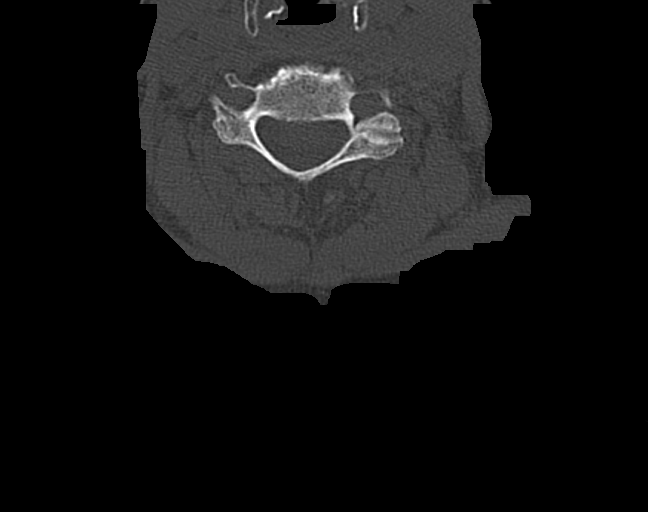
[im 61/92  bone]
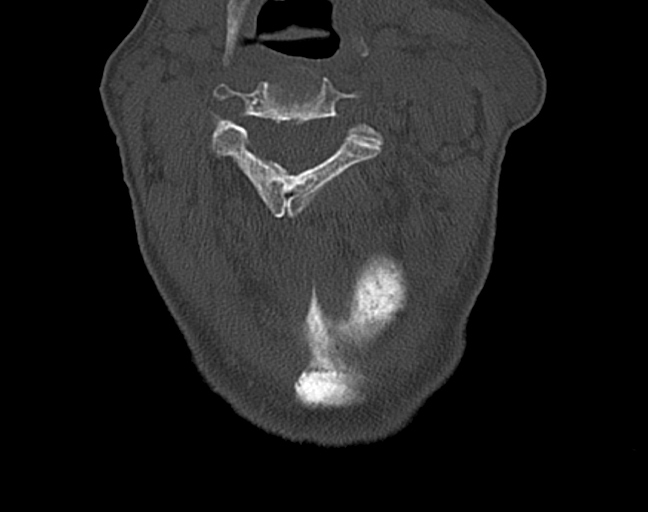
[im 76/92  soft-tissue]
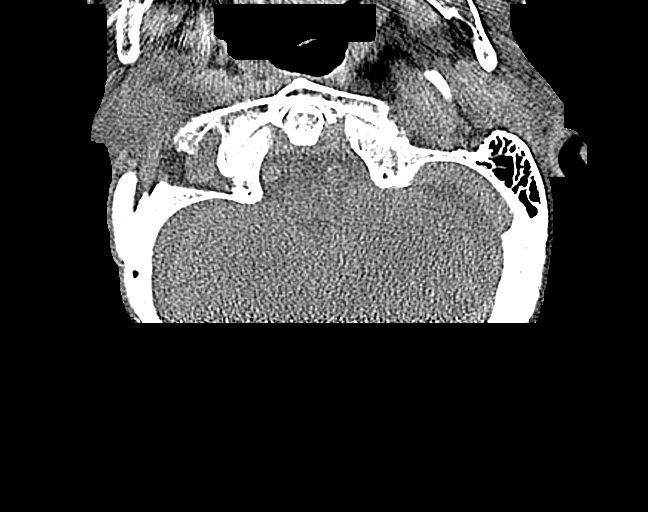
[im 76/92  bone]
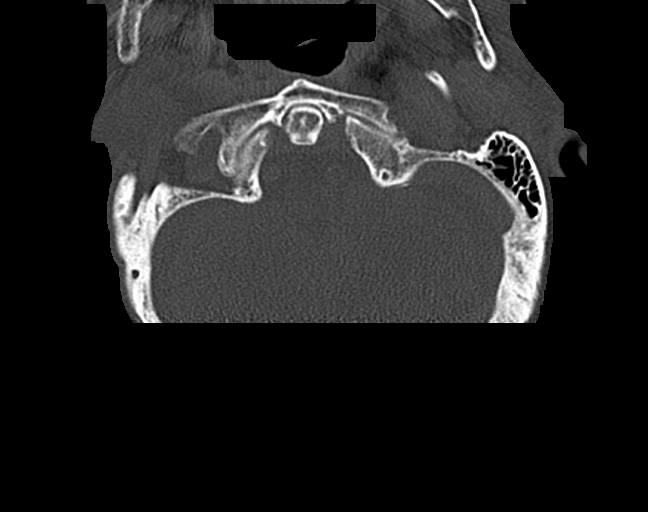

[Series 11: sagittal bone · sagittal · 0.33mm/px · 4 of 46 slices shown]
[im 10/46  bone]
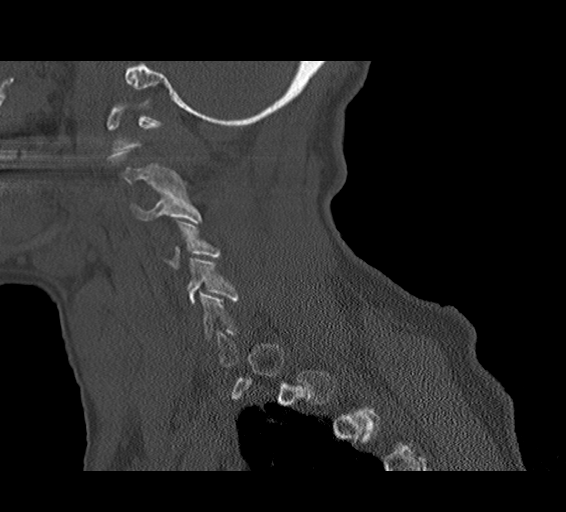
[im 19/46  bone]
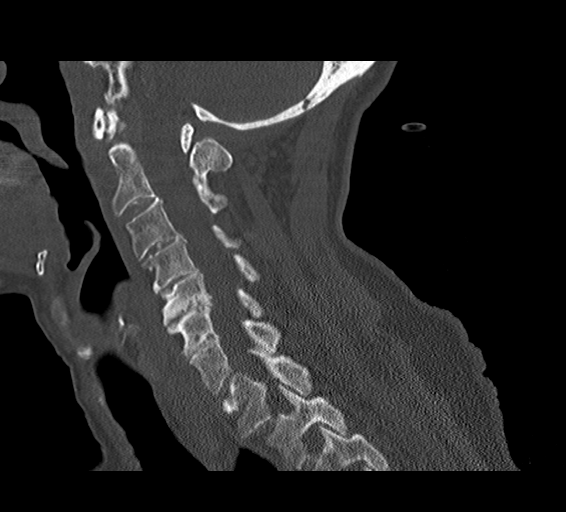
[im 28/46  bone]
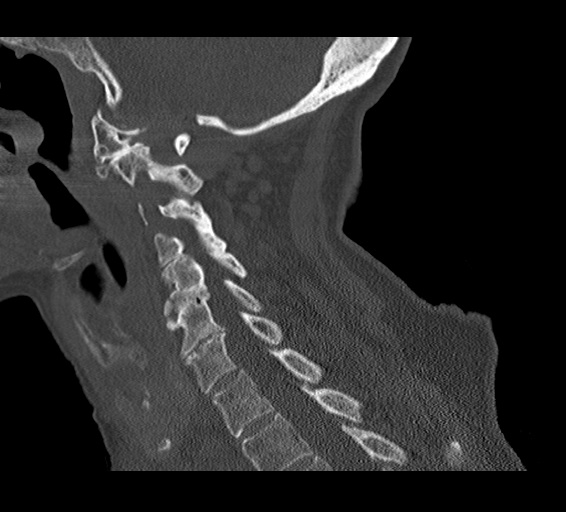
[im 37/46  bone]
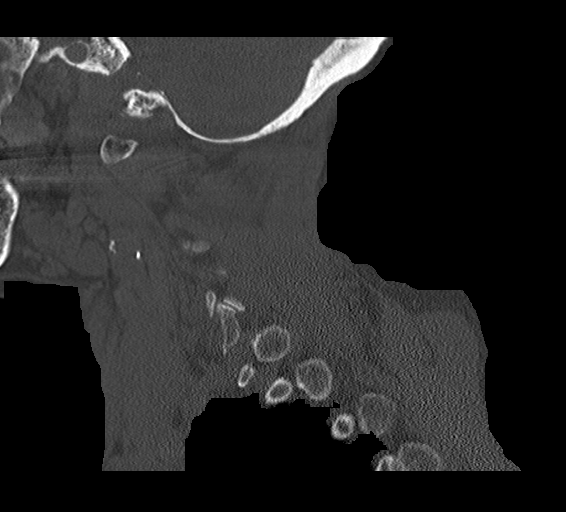

[13 of 33 positions shown; findings below may reference images not displayed]

FINDINGS: CT HEAD FINDINGS

Brain: There is no evidence of acute intracranial hemorrhage, mass
lesion, brain edema or extra-axial fluid collection. There is mild
atrophy with mild prominence of the ventricles and subarachnoid
spaces. Partially calcified meningioma in the floor of the left
anterior cranial fossa is again noted, measuring 2.3 x 1.8 cm on
image [DATE]. There is patchy low-density in the periventricular white
matter, likely due to chronic small vessel ischemic changes. There
is no CT evidence of acute cortical infarction.

Vascular: Intracranial vascular calcifications. No hyperdense vessel
identified.

Skull: Negative for fracture or focal lesion.

Sinuses/Orbits: There is mild mucosal thickening in the right
frontal, ethmoid and maxillary sinuses. There is a possible small
air-fluid level in the right maxillary sinus. The mastoid air cells
and middle ears are clear. No evidence of acute orbital hematoma.
Previous lens surgery on the right. Stable deformity and
calcifications of the left globe, suspected phthisis bulbi.

Other: There is some periorbital soft tissue swelling and gas
superolateral to the right orbit. Possible erosion of the right
mandibular condylar head.

CT CERVICAL SPINE FINDINGS

Alignment: There is straightening with a mild retrolisthesis at
C5-6. No focal angulation.

Skull base and vertebrae: No evidence of acute fracture or traumatic
subluxation.

Soft tissues and spinal canal: No prevertebral fluid or swelling. No
visible canal hematoma.

Disc levels: There is multilevel spondylosis with disc space
narrowing and uncinate spurring. There is multilevel facet fusion,
most notable on the right at C2-3 and on the left from C3 through
C5. No high-grade osseous foraminal narrowing identified.

Upper chest: No acute findings. Carotid atherosclerosis noted. There
is mild nodularity the thyroid gland with a peripherally calcified
11 mm nodule on the left (image 61/8). Unlikely to be clinically
significant.

Other: None.
IMPRESSION: 1. Right periorbital soft tissue injury. No evidence of acute
fracture or acute intracranial process.
2. Grossly stable hemangioma arising from the left orbital roof.
3. Mild chronic small vessel ischemic changes in the periventricular
white matter.
4. Multilevel cervical spondylosis without evidence of acute
fracture, traumatic subluxation or static signs of instability.

## 2018-10-08 DIAGNOSIS — K588 Other irritable bowel syndrome: Secondary | ICD-10-CM | POA: Diagnosis not present

## 2018-10-08 DIAGNOSIS — F419 Anxiety disorder, unspecified: Secondary | ICD-10-CM | POA: Diagnosis not present

## 2018-10-08 DIAGNOSIS — F339 Major depressive disorder, recurrent, unspecified: Secondary | ICD-10-CM | POA: Diagnosis not present

## 2018-10-08 DIAGNOSIS — S81801D Unspecified open wound, right lower leg, subsequent encounter: Secondary | ICD-10-CM | POA: Diagnosis not present

## 2018-10-08 DIAGNOSIS — Z4800 Encounter for change or removal of nonsurgical wound dressing: Secondary | ICD-10-CM | POA: Diagnosis not present

## 2018-10-08 DIAGNOSIS — Z7982 Long term (current) use of aspirin: Secondary | ICD-10-CM | POA: Diagnosis not present

## 2018-10-22 DIAGNOSIS — F0391 Unspecified dementia with behavioral disturbance: Secondary | ICD-10-CM | POA: Diagnosis not present

## 2018-11-06 DIAGNOSIS — G301 Alzheimer's disease with late onset: Secondary | ICD-10-CM | POA: Diagnosis not present

## 2018-11-06 DIAGNOSIS — I509 Heart failure, unspecified: Secondary | ICD-10-CM | POA: Diagnosis not present

## 2018-11-06 DIAGNOSIS — F0391 Unspecified dementia with behavioral disturbance: Secondary | ICD-10-CM | POA: Diagnosis not present

## 2018-11-06 DIAGNOSIS — Z79899 Other long term (current) drug therapy: Secondary | ICD-10-CM | POA: Diagnosis not present

## 2018-11-06 DIAGNOSIS — F339 Major depressive disorder, recurrent, unspecified: Secondary | ICD-10-CM | POA: Diagnosis not present

## 2018-11-20 DIAGNOSIS — K219 Gastro-esophageal reflux disease without esophagitis: Secondary | ICD-10-CM | POA: Diagnosis not present

## 2018-11-20 DIAGNOSIS — G301 Alzheimer's disease with late onset: Secondary | ICD-10-CM | POA: Diagnosis not present

## 2018-11-20 DIAGNOSIS — I509 Heart failure, unspecified: Secondary | ICD-10-CM | POA: Diagnosis not present

## 2018-11-20 DIAGNOSIS — Z79899 Other long term (current) drug therapy: Secondary | ICD-10-CM | POA: Diagnosis not present

## 2018-11-20 DIAGNOSIS — F419 Anxiety disorder, unspecified: Secondary | ICD-10-CM | POA: Diagnosis not present

## 2018-11-27 DIAGNOSIS — R9431 Abnormal electrocardiogram [ECG] [EKG]: Secondary | ICD-10-CM | POA: Diagnosis not present

## 2018-11-27 DIAGNOSIS — T1490XA Injury, unspecified, initial encounter: Secondary | ICD-10-CM | POA: Diagnosis not present

## 2018-11-27 DIAGNOSIS — F419 Anxiety disorder, unspecified: Secondary | ICD-10-CM | POA: Diagnosis not present

## 2018-11-27 DIAGNOSIS — R519 Headache, unspecified: Secondary | ICD-10-CM | POA: Diagnosis not present

## 2018-11-27 DIAGNOSIS — R000 Tachycardia, unspecified: Secondary | ICD-10-CM | POA: Diagnosis not present

## 2018-11-27 DIAGNOSIS — S79912A Unspecified injury of left hip, initial encounter: Secondary | ICD-10-CM | POA: Diagnosis not present

## 2018-11-27 DIAGNOSIS — I509 Heart failure, unspecified: Secondary | ICD-10-CM | POA: Diagnosis not present

## 2018-11-27 DIAGNOSIS — I10 Essential (primary) hypertension: Secondary | ICD-10-CM | POA: Diagnosis not present

## 2018-11-27 DIAGNOSIS — S51812A Laceration without foreign body of left forearm, initial encounter: Secondary | ICD-10-CM | POA: Diagnosis not present

## 2018-11-27 DIAGNOSIS — Z23 Encounter for immunization: Secondary | ICD-10-CM | POA: Diagnosis not present

## 2018-11-27 DIAGNOSIS — S61512A Laceration without foreign body of left wrist, initial encounter: Secondary | ICD-10-CM | POA: Diagnosis not present

## 2018-11-27 DIAGNOSIS — M25552 Pain in left hip: Secondary | ICD-10-CM | POA: Diagnosis not present

## 2018-11-27 DIAGNOSIS — R52 Pain, unspecified: Secondary | ICD-10-CM | POA: Diagnosis not present

## 2018-11-27 DIAGNOSIS — G301 Alzheimer's disease with late onset: Secondary | ICD-10-CM | POA: Diagnosis not present

## 2018-11-27 DIAGNOSIS — S0990XA Unspecified injury of head, initial encounter: Secondary | ICD-10-CM | POA: Diagnosis not present

## 2018-11-27 DIAGNOSIS — M25551 Pain in right hip: Secondary | ICD-10-CM | POA: Diagnosis not present

## 2018-11-27 DIAGNOSIS — S7002XA Contusion of left hip, initial encounter: Secondary | ICD-10-CM | POA: Diagnosis not present

## 2018-11-27 DIAGNOSIS — Z79899 Other long term (current) drug therapy: Secondary | ICD-10-CM | POA: Diagnosis not present

## 2018-11-27 DIAGNOSIS — R404 Transient alteration of awareness: Secondary | ICD-10-CM | POA: Diagnosis not present

## 2018-11-27 DIAGNOSIS — S199XXA Unspecified injury of neck, initial encounter: Secondary | ICD-10-CM | POA: Diagnosis not present

## 2018-11-28 DIAGNOSIS — F0391 Unspecified dementia with behavioral disturbance: Secondary | ICD-10-CM | POA: Diagnosis not present

## 2018-11-28 DIAGNOSIS — Z03818 Encounter for observation for suspected exposure to other biological agents ruled out: Secondary | ICD-10-CM | POA: Diagnosis not present

## 2018-12-21 DIAGNOSIS — E559 Vitamin D deficiency, unspecified: Secondary | ICD-10-CM | POA: Diagnosis not present

## 2018-12-21 DIAGNOSIS — Z79899 Other long term (current) drug therapy: Secondary | ICD-10-CM | POA: Diagnosis not present

## 2018-12-25 DIAGNOSIS — I509 Heart failure, unspecified: Secondary | ICD-10-CM | POA: Diagnosis not present

## 2018-12-25 DIAGNOSIS — Z79899 Other long term (current) drug therapy: Secondary | ICD-10-CM | POA: Diagnosis not present

## 2018-12-25 DIAGNOSIS — G301 Alzheimer's disease with late onset: Secondary | ICD-10-CM | POA: Diagnosis not present

## 2018-12-25 DIAGNOSIS — F419 Anxiety disorder, unspecified: Secondary | ICD-10-CM | POA: Diagnosis not present

## 2018-12-25 DIAGNOSIS — R451 Restlessness and agitation: Secondary | ICD-10-CM | POA: Diagnosis not present

## 2018-12-31 DIAGNOSIS — R451 Restlessness and agitation: Secondary | ICD-10-CM | POA: Diagnosis not present

## 2018-12-31 DIAGNOSIS — G301 Alzheimer's disease with late onset: Secondary | ICD-10-CM | POA: Diagnosis not present

## 2018-12-31 DIAGNOSIS — F419 Anxiety disorder, unspecified: Secondary | ICD-10-CM | POA: Diagnosis not present

## 2019-01-05 DIAGNOSIS — R0789 Other chest pain: Secondary | ICD-10-CM | POA: Diagnosis not present

## 2019-01-05 DIAGNOSIS — F0390 Unspecified dementia without behavioral disturbance: Secondary | ICD-10-CM | POA: Diagnosis not present

## 2019-01-05 DIAGNOSIS — R4189 Other symptoms and signs involving cognitive functions and awareness: Secondary | ICD-10-CM | POA: Diagnosis not present

## 2019-01-05 DIAGNOSIS — R079 Chest pain, unspecified: Secondary | ICD-10-CM | POA: Diagnosis not present

## 2019-01-05 DIAGNOSIS — T1490XA Injury, unspecified, initial encounter: Secondary | ICD-10-CM | POA: Diagnosis not present

## 2019-01-05 DIAGNOSIS — G4489 Other headache syndrome: Secondary | ICD-10-CM | POA: Diagnosis not present

## 2019-01-05 DIAGNOSIS — Z9189 Other specified personal risk factors, not elsewhere classified: Secondary | ICD-10-CM | POA: Diagnosis not present

## 2019-01-05 DIAGNOSIS — R52 Pain, unspecified: Secondary | ICD-10-CM | POA: Diagnosis not present

## 2019-01-05 DIAGNOSIS — Z79899 Other long term (current) drug therapy: Secondary | ICD-10-CM | POA: Diagnosis not present

## 2019-01-05 DIAGNOSIS — G9389 Other specified disorders of brain: Secondary | ICD-10-CM | POA: Diagnosis not present

## 2019-01-05 DIAGNOSIS — R58 Hemorrhage, not elsewhere classified: Secondary | ICD-10-CM | POA: Diagnosis not present

## 2019-01-05 DIAGNOSIS — S0990XA Unspecified injury of head, initial encounter: Secondary | ICD-10-CM | POA: Diagnosis not present

## 2019-01-05 DIAGNOSIS — S0083XA Contusion of other part of head, initial encounter: Secondary | ICD-10-CM | POA: Diagnosis not present

## 2019-01-05 DIAGNOSIS — W19XXXA Unspecified fall, initial encounter: Secondary | ICD-10-CM | POA: Diagnosis not present

## 2019-01-08 DIAGNOSIS — I509 Heart failure, unspecified: Secondary | ICD-10-CM | POA: Diagnosis not present

## 2019-01-08 DIAGNOSIS — W06XXXD Fall from bed, subsequent encounter: Secondary | ICD-10-CM | POA: Diagnosis not present

## 2019-01-08 DIAGNOSIS — Z79899 Other long term (current) drug therapy: Secondary | ICD-10-CM | POA: Diagnosis not present

## 2019-01-08 DIAGNOSIS — G301 Alzheimer's disease with late onset: Secondary | ICD-10-CM | POA: Diagnosis not present

## 2019-01-08 DIAGNOSIS — F419 Anxiety disorder, unspecified: Secondary | ICD-10-CM | POA: Diagnosis not present

## 2019-01-08 DIAGNOSIS — S0011XD Contusion of right eyelid and periocular area, subsequent encounter: Secondary | ICD-10-CM | POA: Diagnosis not present

## 2019-01-09 DIAGNOSIS — Z20828 Contact with and (suspected) exposure to other viral communicable diseases: Secondary | ICD-10-CM | POA: Diagnosis not present

## 2019-01-22 DIAGNOSIS — F0391 Unspecified dementia with behavioral disturbance: Secondary | ICD-10-CM | POA: Diagnosis not present

## 2019-01-22 DIAGNOSIS — F419 Anxiety disorder, unspecified: Secondary | ICD-10-CM | POA: Diagnosis not present

## 2019-01-22 DIAGNOSIS — I509 Heart failure, unspecified: Secondary | ICD-10-CM | POA: Diagnosis not present

## 2019-01-22 DIAGNOSIS — G301 Alzheimer's disease with late onset: Secondary | ICD-10-CM | POA: Diagnosis not present

## 2019-01-29 DIAGNOSIS — I509 Heart failure, unspecified: Secondary | ICD-10-CM | POA: Diagnosis not present

## 2019-01-29 DIAGNOSIS — G301 Alzheimer's disease with late onset: Secondary | ICD-10-CM | POA: Diagnosis not present

## 2019-01-29 DIAGNOSIS — R634 Abnormal weight loss: Secondary | ICD-10-CM | POA: Diagnosis not present

## 2019-01-29 DIAGNOSIS — F419 Anxiety disorder, unspecified: Secondary | ICD-10-CM | POA: Diagnosis not present

## 2019-01-30 DIAGNOSIS — F419 Anxiety disorder, unspecified: Secondary | ICD-10-CM | POA: Diagnosis not present

## 2019-01-30 DIAGNOSIS — R454 Irritability and anger: Secondary | ICD-10-CM | POA: Diagnosis not present

## 2019-01-30 DIAGNOSIS — G301 Alzheimer's disease with late onset: Secondary | ICD-10-CM | POA: Diagnosis not present

## 2019-02-01 DIAGNOSIS — Z79899 Other long term (current) drug therapy: Secondary | ICD-10-CM | POA: Diagnosis not present

## 2019-02-01 DIAGNOSIS — Z20828 Contact with and (suspected) exposure to other viral communicable diseases: Secondary | ICD-10-CM | POA: Diagnosis not present

## 2019-02-05 DIAGNOSIS — F419 Anxiety disorder, unspecified: Secondary | ICD-10-CM | POA: Diagnosis not present

## 2019-02-05 DIAGNOSIS — G301 Alzheimer's disease with late onset: Secondary | ICD-10-CM | POA: Diagnosis not present

## 2019-02-05 DIAGNOSIS — I509 Heart failure, unspecified: Secondary | ICD-10-CM | POA: Diagnosis not present

## 2019-02-05 DIAGNOSIS — E871 Hypo-osmolality and hyponatremia: Secondary | ICD-10-CM | POA: Diagnosis not present

## 2019-02-05 DIAGNOSIS — Z79899 Other long term (current) drug therapy: Secondary | ICD-10-CM | POA: Diagnosis not present

## 2019-02-06 DIAGNOSIS — Z20828 Contact with and (suspected) exposure to other viral communicable diseases: Secondary | ICD-10-CM | POA: Diagnosis not present

## 2019-02-13 DIAGNOSIS — F329 Major depressive disorder, single episode, unspecified: Secondary | ICD-10-CM | POA: Diagnosis not present

## 2019-02-13 DIAGNOSIS — G301 Alzheimer's disease with late onset: Secondary | ICD-10-CM | POA: Diagnosis not present

## 2019-02-13 DIAGNOSIS — F419 Anxiety disorder, unspecified: Secondary | ICD-10-CM | POA: Diagnosis not present

## 2019-02-19 DIAGNOSIS — Z79899 Other long term (current) drug therapy: Secondary | ICD-10-CM | POA: Diagnosis not present

## 2019-02-19 DIAGNOSIS — I509 Heart failure, unspecified: Secondary | ICD-10-CM | POA: Diagnosis not present

## 2019-02-19 DIAGNOSIS — R451 Restlessness and agitation: Secondary | ICD-10-CM | POA: Diagnosis not present

## 2019-02-19 DIAGNOSIS — G301 Alzheimer's disease with late onset: Secondary | ICD-10-CM | POA: Diagnosis not present

## 2019-02-19 DIAGNOSIS — F419 Anxiety disorder, unspecified: Secondary | ICD-10-CM | POA: Diagnosis not present

## 2019-02-21 DIAGNOSIS — Z79899 Other long term (current) drug therapy: Secondary | ICD-10-CM | POA: Diagnosis not present

## 2019-02-21 DIAGNOSIS — G301 Alzheimer's disease with late onset: Secondary | ICD-10-CM | POA: Diagnosis not present

## 2019-02-21 DIAGNOSIS — I509 Heart failure, unspecified: Secondary | ICD-10-CM | POA: Diagnosis not present

## 2019-02-21 DIAGNOSIS — M25511 Pain in right shoulder: Secondary | ICD-10-CM | POA: Diagnosis not present

## 2019-02-21 DIAGNOSIS — R21 Rash and other nonspecific skin eruption: Secondary | ICD-10-CM | POA: Diagnosis not present

## 2019-02-26 DIAGNOSIS — Z79899 Other long term (current) drug therapy: Secondary | ICD-10-CM | POA: Diagnosis not present

## 2019-02-26 DIAGNOSIS — G301 Alzheimer's disease with late onset: Secondary | ICD-10-CM | POA: Diagnosis not present

## 2019-02-26 DIAGNOSIS — F419 Anxiety disorder, unspecified: Secondary | ICD-10-CM | POA: Diagnosis not present

## 2019-02-26 DIAGNOSIS — I509 Heart failure, unspecified: Secondary | ICD-10-CM | POA: Diagnosis not present

## 2019-02-26 DIAGNOSIS — R21 Rash and other nonspecific skin eruption: Secondary | ICD-10-CM | POA: Diagnosis not present

## 2019-03-01 DIAGNOSIS — Z79899 Other long term (current) drug therapy: Secondary | ICD-10-CM | POA: Diagnosis not present

## 2019-03-01 DIAGNOSIS — E871 Hypo-osmolality and hyponatremia: Secondary | ICD-10-CM | POA: Diagnosis not present

## 2019-03-06 DIAGNOSIS — G301 Alzheimer's disease with late onset: Secondary | ICD-10-CM | POA: Diagnosis not present

## 2019-03-06 DIAGNOSIS — F419 Anxiety disorder, unspecified: Secondary | ICD-10-CM | POA: Diagnosis not present

## 2019-03-06 DIAGNOSIS — F329 Major depressive disorder, single episode, unspecified: Secondary | ICD-10-CM | POA: Diagnosis not present

## 2019-03-12 DIAGNOSIS — E871 Hypo-osmolality and hyponatremia: Secondary | ICD-10-CM | POA: Diagnosis not present

## 2019-03-12 DIAGNOSIS — F419 Anxiety disorder, unspecified: Secondary | ICD-10-CM | POA: Diagnosis not present

## 2019-03-12 DIAGNOSIS — I509 Heart failure, unspecified: Secondary | ICD-10-CM | POA: Diagnosis not present

## 2019-03-12 DIAGNOSIS — G301 Alzheimer's disease with late onset: Secondary | ICD-10-CM | POA: Diagnosis not present

## 2019-03-19 DIAGNOSIS — F419 Anxiety disorder, unspecified: Secondary | ICD-10-CM | POA: Diagnosis not present

## 2019-03-19 DIAGNOSIS — G301 Alzheimer's disease with late onset: Secondary | ICD-10-CM | POA: Diagnosis not present

## 2019-03-19 DIAGNOSIS — I509 Heart failure, unspecified: Secondary | ICD-10-CM | POA: Diagnosis not present

## 2019-03-19 DIAGNOSIS — W19XXXA Unspecified fall, initial encounter: Secondary | ICD-10-CM | POA: Diagnosis not present

## 2019-03-19 DIAGNOSIS — R2681 Unsteadiness on feet: Secondary | ICD-10-CM | POA: Diagnosis not present

## 2019-03-25 DIAGNOSIS — M1990 Unspecified osteoarthritis, unspecified site: Secondary | ICD-10-CM | POA: Diagnosis not present

## 2019-03-25 DIAGNOSIS — Z880 Allergy status to penicillin: Secondary | ICD-10-CM | POA: Diagnosis not present

## 2019-03-25 DIAGNOSIS — H548 Legal blindness, as defined in USA: Secondary | ICD-10-CM | POA: Diagnosis not present

## 2019-03-25 DIAGNOSIS — M12811 Other specific arthropathies, not elsewhere classified, right shoulder: Secondary | ICD-10-CM | POA: Diagnosis not present

## 2019-03-25 DIAGNOSIS — Z7982 Long term (current) use of aspirin: Secondary | ICD-10-CM | POA: Diagnosis not present

## 2019-03-25 DIAGNOSIS — E559 Vitamin D deficiency, unspecified: Secondary | ICD-10-CM | POA: Diagnosis not present

## 2019-04-03 DIAGNOSIS — B351 Tinea unguium: Secondary | ICD-10-CM | POA: Diagnosis not present

## 2019-04-03 DIAGNOSIS — R2681 Unsteadiness on feet: Secondary | ICD-10-CM | POA: Diagnosis not present

## 2019-04-03 DIAGNOSIS — L84 Corns and callosities: Secondary | ICD-10-CM | POA: Diagnosis not present

## 2019-04-03 DIAGNOSIS — I739 Peripheral vascular disease, unspecified: Secondary | ICD-10-CM | POA: Diagnosis not present

## 2019-04-16 DIAGNOSIS — I509 Heart failure, unspecified: Secondary | ICD-10-CM | POA: Diagnosis not present

## 2019-04-16 DIAGNOSIS — S60222A Contusion of left hand, initial encounter: Secondary | ICD-10-CM | POA: Diagnosis not present

## 2019-04-16 DIAGNOSIS — G301 Alzheimer's disease with late onset: Secondary | ICD-10-CM | POA: Diagnosis not present

## 2019-04-16 DIAGNOSIS — F419 Anxiety disorder, unspecified: Secondary | ICD-10-CM | POA: Diagnosis not present

## 2019-04-17 DIAGNOSIS — F419 Anxiety disorder, unspecified: Secondary | ICD-10-CM | POA: Diagnosis not present

## 2019-04-17 DIAGNOSIS — F320 Major depressive disorder, single episode, mild: Secondary | ICD-10-CM | POA: Diagnosis not present

## 2019-04-17 DIAGNOSIS — G301 Alzheimer's disease with late onset: Secondary | ICD-10-CM | POA: Diagnosis not present

## 2019-04-23 DIAGNOSIS — I509 Heart failure, unspecified: Secondary | ICD-10-CM | POA: Diagnosis not present

## 2019-04-23 DIAGNOSIS — F419 Anxiety disorder, unspecified: Secondary | ICD-10-CM | POA: Diagnosis not present

## 2019-04-23 DIAGNOSIS — R451 Restlessness and agitation: Secondary | ICD-10-CM | POA: Diagnosis not present

## 2019-04-23 DIAGNOSIS — G301 Alzheimer's disease with late onset: Secondary | ICD-10-CM | POA: Diagnosis not present

## 2019-04-26 DIAGNOSIS — R404 Transient alteration of awareness: Secondary | ICD-10-CM | POA: Diagnosis not present

## 2019-04-26 DIAGNOSIS — T1490XA Injury, unspecified, initial encounter: Secondary | ICD-10-CM | POA: Diagnosis not present

## 2019-04-26 DIAGNOSIS — R52 Pain, unspecified: Secondary | ICD-10-CM | POA: Diagnosis not present

## 2019-04-26 DIAGNOSIS — M5489 Other dorsalgia: Secondary | ICD-10-CM | POA: Diagnosis not present

## 2019-04-26 DIAGNOSIS — R402411 Glasgow coma scale score 13-15, in the field [EMT or ambulance]: Secondary | ICD-10-CM | POA: Diagnosis not present

## 2019-04-26 DIAGNOSIS — S199XXA Unspecified injury of neck, initial encounter: Secondary | ICD-10-CM | POA: Diagnosis not present

## 2019-04-26 DIAGNOSIS — S0990XA Unspecified injury of head, initial encounter: Secondary | ICD-10-CM | POA: Diagnosis not present

## 2019-04-26 DIAGNOSIS — Z79899 Other long term (current) drug therapy: Secondary | ICD-10-CM | POA: Diagnosis not present

## 2019-04-26 DIAGNOSIS — M545 Low back pain: Secondary | ICD-10-CM | POA: Diagnosis not present

## 2019-04-26 DIAGNOSIS — S3993XA Unspecified injury of pelvis, initial encounter: Secondary | ICD-10-CM | POA: Diagnosis not present

## 2019-04-30 DIAGNOSIS — E871 Hypo-osmolality and hyponatremia: Secondary | ICD-10-CM | POA: Diagnosis not present

## 2019-04-30 DIAGNOSIS — G301 Alzheimer's disease with late onset: Secondary | ICD-10-CM | POA: Diagnosis not present

## 2019-04-30 DIAGNOSIS — F419 Anxiety disorder, unspecified: Secondary | ICD-10-CM | POA: Diagnosis not present

## 2019-04-30 DIAGNOSIS — I509 Heart failure, unspecified: Secondary | ICD-10-CM | POA: Diagnosis not present

## 2019-05-13 DIAGNOSIS — M12811 Other specific arthropathies, not elsewhere classified, right shoulder: Secondary | ICD-10-CM | POA: Diagnosis not present

## 2019-05-13 DIAGNOSIS — M1711 Unilateral primary osteoarthritis, right knee: Secondary | ICD-10-CM | POA: Diagnosis not present

## 2019-05-13 DIAGNOSIS — M810 Age-related osteoporosis without current pathological fracture: Secondary | ICD-10-CM | POA: Diagnosis not present

## 2019-05-14 DIAGNOSIS — F419 Anxiety disorder, unspecified: Secondary | ICD-10-CM | POA: Diagnosis not present

## 2019-05-14 DIAGNOSIS — I509 Heart failure, unspecified: Secondary | ICD-10-CM | POA: Diagnosis not present

## 2019-05-14 DIAGNOSIS — G301 Alzheimer's disease with late onset: Secondary | ICD-10-CM | POA: Diagnosis not present

## 2019-05-14 DIAGNOSIS — S5012XA Contusion of left forearm, initial encounter: Secondary | ICD-10-CM | POA: Diagnosis not present

## 2019-05-14 DIAGNOSIS — W19XXXA Unspecified fall, initial encounter: Secondary | ICD-10-CM | POA: Diagnosis not present

## 2019-05-15 DIAGNOSIS — F419 Anxiety disorder, unspecified: Secondary | ICD-10-CM | POA: Diagnosis not present

## 2019-05-15 DIAGNOSIS — F320 Major depressive disorder, single episode, mild: Secondary | ICD-10-CM | POA: Diagnosis not present

## 2019-05-15 DIAGNOSIS — G301 Alzheimer's disease with late onset: Secondary | ICD-10-CM | POA: Diagnosis not present

## 2019-05-21 DIAGNOSIS — R4689 Other symptoms and signs involving appearance and behavior: Secondary | ICD-10-CM | POA: Diagnosis not present

## 2019-05-21 DIAGNOSIS — F419 Anxiety disorder, unspecified: Secondary | ICD-10-CM | POA: Diagnosis not present

## 2019-05-21 DIAGNOSIS — G301 Alzheimer's disease with late onset: Secondary | ICD-10-CM | POA: Diagnosis not present

## 2019-05-21 DIAGNOSIS — F0281 Dementia in other diseases classified elsewhere with behavioral disturbance: Secondary | ICD-10-CM | POA: Diagnosis not present

## 2019-05-21 DIAGNOSIS — I509 Heart failure, unspecified: Secondary | ICD-10-CM | POA: Diagnosis not present

## 2019-05-24 DIAGNOSIS — N390 Urinary tract infection, site not specified: Secondary | ICD-10-CM | POA: Diagnosis not present

## 2019-05-28 DIAGNOSIS — F419 Anxiety disorder, unspecified: Secondary | ICD-10-CM | POA: Diagnosis not present

## 2019-05-28 DIAGNOSIS — G301 Alzheimer's disease with late onset: Secondary | ICD-10-CM | POA: Diagnosis not present

## 2019-05-28 DIAGNOSIS — I509 Heart failure, unspecified: Secondary | ICD-10-CM | POA: Diagnosis not present

## 2019-05-28 DIAGNOSIS — F0281 Dementia in other diseases classified elsewhere with behavioral disturbance: Secondary | ICD-10-CM | POA: Diagnosis not present

## 2019-05-31 DIAGNOSIS — M25552 Pain in left hip: Secondary | ICD-10-CM | POA: Diagnosis not present

## 2019-05-31 DIAGNOSIS — Z9189 Other specified personal risk factors, not elsewhere classified: Secondary | ICD-10-CM | POA: Diagnosis not present

## 2019-05-31 DIAGNOSIS — Z880 Allergy status to penicillin: Secondary | ICD-10-CM | POA: Diagnosis not present

## 2019-05-31 DIAGNOSIS — H548 Legal blindness, as defined in USA: Secondary | ICD-10-CM | POA: Diagnosis not present

## 2019-05-31 DIAGNOSIS — R9431 Abnormal electrocardiogram [ECG] [EKG]: Secondary | ICD-10-CM | POA: Diagnosis not present

## 2019-05-31 DIAGNOSIS — S199XXA Unspecified injury of neck, initial encounter: Secondary | ICD-10-CM | POA: Diagnosis not present

## 2019-05-31 DIAGNOSIS — F0390 Unspecified dementia without behavioral disturbance: Secondary | ICD-10-CM | POA: Diagnosis not present

## 2019-05-31 DIAGNOSIS — R404 Transient alteration of awareness: Secondary | ICD-10-CM | POA: Diagnosis not present

## 2019-05-31 DIAGNOSIS — W19XXXA Unspecified fall, initial encounter: Secondary | ICD-10-CM | POA: Diagnosis not present

## 2019-05-31 DIAGNOSIS — S41111A Laceration without foreign body of right upper arm, initial encounter: Secondary | ICD-10-CM | POA: Diagnosis not present

## 2019-05-31 DIAGNOSIS — R4189 Other symptoms and signs involving cognitive functions and awareness: Secondary | ICD-10-CM | POA: Diagnosis not present

## 2019-05-31 DIAGNOSIS — Z79899 Other long term (current) drug therapy: Secondary | ICD-10-CM | POA: Diagnosis not present

## 2019-05-31 DIAGNOSIS — R296 Repeated falls: Secondary | ICD-10-CM | POA: Diagnosis not present

## 2019-05-31 DIAGNOSIS — Z882 Allergy status to sulfonamides status: Secondary | ICD-10-CM | POA: Diagnosis not present

## 2019-05-31 DIAGNOSIS — I959 Hypotension, unspecified: Secondary | ICD-10-CM | POA: Diagnosis not present

## 2019-05-31 DIAGNOSIS — S0990XA Unspecified injury of head, initial encounter: Secondary | ICD-10-CM | POA: Diagnosis not present

## 2019-05-31 DIAGNOSIS — S51011A Laceration without foreign body of right elbow, initial encounter: Secondary | ICD-10-CM | POA: Diagnosis not present

## 2019-06-04 DIAGNOSIS — M25511 Pain in right shoulder: Secondary | ICD-10-CM | POA: Diagnosis not present

## 2019-06-04 DIAGNOSIS — G301 Alzheimer's disease with late onset: Secondary | ICD-10-CM | POA: Diagnosis not present

## 2019-06-04 DIAGNOSIS — M545 Low back pain: Secondary | ICD-10-CM | POA: Diagnosis not present

## 2019-06-04 DIAGNOSIS — W19XXXA Unspecified fall, initial encounter: Secondary | ICD-10-CM | POA: Diagnosis not present

## 2019-06-04 DIAGNOSIS — F0281 Dementia in other diseases classified elsewhere with behavioral disturbance: Secondary | ICD-10-CM | POA: Diagnosis not present

## 2019-06-04 DIAGNOSIS — F419 Anxiety disorder, unspecified: Secondary | ICD-10-CM | POA: Diagnosis not present

## 2019-06-04 DIAGNOSIS — S51011A Laceration without foreign body of right elbow, initial encounter: Secondary | ICD-10-CM | POA: Diagnosis not present

## 2019-06-05 DIAGNOSIS — L84 Corns and callosities: Secondary | ICD-10-CM | POA: Diagnosis not present

## 2019-06-05 DIAGNOSIS — B351 Tinea unguium: Secondary | ICD-10-CM | POA: Diagnosis not present

## 2019-06-05 DIAGNOSIS — R2681 Unsteadiness on feet: Secondary | ICD-10-CM | POA: Diagnosis not present

## 2019-06-05 DIAGNOSIS — I739 Peripheral vascular disease, unspecified: Secondary | ICD-10-CM | POA: Diagnosis not present

## 2019-06-12 DIAGNOSIS — F411 Generalized anxiety disorder: Secondary | ICD-10-CM | POA: Diagnosis not present

## 2019-06-12 DIAGNOSIS — F22 Delusional disorders: Secondary | ICD-10-CM | POA: Diagnosis not present

## 2019-06-12 DIAGNOSIS — F3341 Major depressive disorder, recurrent, in partial remission: Secondary | ICD-10-CM | POA: Diagnosis not present

## 2019-06-12 DIAGNOSIS — G309 Alzheimer's disease, unspecified: Secondary | ICD-10-CM | POA: Diagnosis not present

## 2019-06-25 DIAGNOSIS — F0281 Dementia in other diseases classified elsewhere with behavioral disturbance: Secondary | ICD-10-CM | POA: Diagnosis not present

## 2019-06-25 DIAGNOSIS — R296 Repeated falls: Secondary | ICD-10-CM | POA: Diagnosis not present

## 2019-06-25 DIAGNOSIS — G301 Alzheimer's disease with late onset: Secondary | ICD-10-CM | POA: Diagnosis not present

## 2019-06-25 DIAGNOSIS — I509 Heart failure, unspecified: Secondary | ICD-10-CM | POA: Diagnosis not present

## 2019-06-25 DIAGNOSIS — F419 Anxiety disorder, unspecified: Secondary | ICD-10-CM | POA: Diagnosis not present

## 2019-07-17 DIAGNOSIS — F0281 Dementia in other diseases classified elsewhere with behavioral disturbance: Secondary | ICD-10-CM | POA: Diagnosis not present

## 2019-07-17 DIAGNOSIS — F419 Anxiety disorder, unspecified: Secondary | ICD-10-CM | POA: Diagnosis not present

## 2019-07-23 DIAGNOSIS — G301 Alzheimer's disease with late onset: Secondary | ICD-10-CM | POA: Diagnosis not present

## 2019-07-23 DIAGNOSIS — F0281 Dementia in other diseases classified elsewhere with behavioral disturbance: Secondary | ICD-10-CM | POA: Diagnosis not present

## 2019-07-23 DIAGNOSIS — I509 Heart failure, unspecified: Secondary | ICD-10-CM | POA: Diagnosis not present

## 2019-07-23 DIAGNOSIS — R451 Restlessness and agitation: Secondary | ICD-10-CM | POA: Diagnosis not present

## 2019-07-23 DIAGNOSIS — F419 Anxiety disorder, unspecified: Secondary | ICD-10-CM | POA: Diagnosis not present

## 2019-08-05 DIAGNOSIS — M1711 Unilateral primary osteoarthritis, right knee: Secondary | ICD-10-CM | POA: Diagnosis not present

## 2019-08-05 DIAGNOSIS — M810 Age-related osteoporosis without current pathological fracture: Secondary | ICD-10-CM | POA: Diagnosis not present

## 2019-08-05 DIAGNOSIS — M12811 Other specific arthropathies, not elsewhere classified, right shoulder: Secondary | ICD-10-CM | POA: Diagnosis not present

## 2019-08-21 DIAGNOSIS — F419 Anxiety disorder, unspecified: Secondary | ICD-10-CM | POA: Diagnosis not present

## 2019-08-21 DIAGNOSIS — F339 Major depressive disorder, recurrent, unspecified: Secondary | ICD-10-CM | POA: Diagnosis not present

## 2019-08-21 DIAGNOSIS — F0391 Unspecified dementia with behavioral disturbance: Secondary | ICD-10-CM | POA: Diagnosis not present

## 2019-08-22 DIAGNOSIS — R451 Restlessness and agitation: Secondary | ICD-10-CM | POA: Diagnosis not present

## 2019-08-22 DIAGNOSIS — F419 Anxiety disorder, unspecified: Secondary | ICD-10-CM | POA: Diagnosis not present

## 2019-08-22 DIAGNOSIS — G301 Alzheimer's disease with late onset: Secondary | ICD-10-CM | POA: Diagnosis not present

## 2019-08-22 DIAGNOSIS — I509 Heart failure, unspecified: Secondary | ICD-10-CM | POA: Diagnosis not present

## 2019-08-22 DIAGNOSIS — F0281 Dementia in other diseases classified elsewhere with behavioral disturbance: Secondary | ICD-10-CM | POA: Diagnosis not present

## 2019-09-18 DIAGNOSIS — F419 Anxiety disorder, unspecified: Secondary | ICD-10-CM | POA: Diagnosis not present

## 2019-09-18 DIAGNOSIS — F0391 Unspecified dementia with behavioral disturbance: Secondary | ICD-10-CM | POA: Diagnosis not present

## 2019-09-18 DIAGNOSIS — F339 Major depressive disorder, recurrent, unspecified: Secondary | ICD-10-CM | POA: Diagnosis not present

## 2019-09-25 DIAGNOSIS — F419 Anxiety disorder, unspecified: Secondary | ICD-10-CM | POA: Diagnosis not present

## 2019-09-25 DIAGNOSIS — G3184 Mild cognitive impairment, so stated: Secondary | ICD-10-CM | POA: Diagnosis not present

## 2019-09-25 DIAGNOSIS — K219 Gastro-esophageal reflux disease without esophagitis: Secondary | ICD-10-CM | POA: Diagnosis not present

## 2019-10-16 DIAGNOSIS — F320 Major depressive disorder, single episode, mild: Secondary | ICD-10-CM | POA: Diagnosis not present

## 2019-10-16 DIAGNOSIS — F419 Anxiety disorder, unspecified: Secondary | ICD-10-CM | POA: Diagnosis not present

## 2019-10-16 DIAGNOSIS — G301 Alzheimer's disease with late onset: Secondary | ICD-10-CM | POA: Diagnosis not present

## 2019-11-06 DIAGNOSIS — K219 Gastro-esophageal reflux disease without esophagitis: Secondary | ICD-10-CM | POA: Diagnosis not present

## 2019-11-06 DIAGNOSIS — K588 Other irritable bowel syndrome: Secondary | ICD-10-CM | POA: Diagnosis not present

## 2019-11-06 DIAGNOSIS — E559 Vitamin D deficiency, unspecified: Secondary | ICD-10-CM | POA: Diagnosis not present

## 2019-11-06 DIAGNOSIS — F339 Major depressive disorder, recurrent, unspecified: Secondary | ICD-10-CM | POA: Diagnosis not present

## 2019-11-13 DIAGNOSIS — G301 Alzheimer's disease with late onset: Secondary | ICD-10-CM | POA: Diagnosis not present

## 2019-11-13 DIAGNOSIS — F320 Major depressive disorder, single episode, mild: Secondary | ICD-10-CM | POA: Diagnosis not present

## 2019-11-13 DIAGNOSIS — F419 Anxiety disorder, unspecified: Secondary | ICD-10-CM | POA: Diagnosis not present

## 2019-12-18 DIAGNOSIS — F419 Anxiety disorder, unspecified: Secondary | ICD-10-CM | POA: Diagnosis not present

## 2019-12-18 DIAGNOSIS — F339 Major depressive disorder, recurrent, unspecified: Secondary | ICD-10-CM | POA: Diagnosis not present

## 2019-12-18 DIAGNOSIS — G301 Alzheimer's disease with late onset: Secondary | ICD-10-CM | POA: Diagnosis not present

## 2019-12-28 DIAGNOSIS — Z03818 Encounter for observation for suspected exposure to other biological agents ruled out: Secondary | ICD-10-CM | POA: Diagnosis not present

## 2020-01-06 DIAGNOSIS — Z03818 Encounter for observation for suspected exposure to other biological agents ruled out: Secondary | ICD-10-CM | POA: Diagnosis not present

## 2020-01-17 DIAGNOSIS — S79921A Unspecified injury of right thigh, initial encounter: Secondary | ICD-10-CM | POA: Diagnosis not present

## 2020-01-17 DIAGNOSIS — S8991XA Unspecified injury of right lower leg, initial encounter: Secondary | ICD-10-CM | POA: Diagnosis not present

## 2020-01-17 DIAGNOSIS — R402411 Glasgow coma scale score 13-15, in the field [EMT or ambulance]: Secondary | ICD-10-CM | POA: Diagnosis not present

## 2020-01-17 DIAGNOSIS — R404 Transient alteration of awareness: Secondary | ICD-10-CM | POA: Diagnosis not present

## 2020-01-17 DIAGNOSIS — R410 Disorientation, unspecified: Secondary | ICD-10-CM | POA: Diagnosis not present

## 2020-01-17 DIAGNOSIS — S79911A Unspecified injury of right hip, initial encounter: Secondary | ICD-10-CM | POA: Diagnosis not present

## 2020-01-17 DIAGNOSIS — S7001XA Contusion of right hip, initial encounter: Secondary | ICD-10-CM | POA: Diagnosis not present

## 2020-01-17 DIAGNOSIS — S0003XA Contusion of scalp, initial encounter: Secondary | ICD-10-CM | POA: Diagnosis not present

## 2020-01-17 DIAGNOSIS — S0990XA Unspecified injury of head, initial encounter: Secondary | ICD-10-CM | POA: Diagnosis not present

## 2020-01-17 DIAGNOSIS — M549 Dorsalgia, unspecified: Secondary | ICD-10-CM | POA: Diagnosis not present

## 2020-01-17 DIAGNOSIS — R0902 Hypoxemia: Secondary | ICD-10-CM | POA: Diagnosis not present

## 2020-01-17 DIAGNOSIS — S8001XA Contusion of right knee, initial encounter: Secondary | ICD-10-CM | POA: Diagnosis not present

## 2020-01-20 DIAGNOSIS — R4189 Other symptoms and signs involving cognitive functions and awareness: Secondary | ICD-10-CM | POA: Diagnosis not present

## 2020-01-20 DIAGNOSIS — F418 Other specified anxiety disorders: Secondary | ICD-10-CM | POA: Diagnosis not present

## 2020-01-20 DIAGNOSIS — S299XXA Unspecified injury of thorax, initial encounter: Secondary | ICD-10-CM | POA: Diagnosis not present

## 2020-01-20 DIAGNOSIS — S4991XA Unspecified injury of right shoulder and upper arm, initial encounter: Secondary | ICD-10-CM | POA: Diagnosis not present

## 2020-01-20 DIAGNOSIS — S40021A Contusion of right upper arm, initial encounter: Secondary | ICD-10-CM | POA: Diagnosis not present

## 2020-01-20 DIAGNOSIS — R410 Disorientation, unspecified: Secondary | ICD-10-CM | POA: Diagnosis not present

## 2020-01-20 DIAGNOSIS — R58 Hemorrhage, not elsewhere classified: Secondary | ICD-10-CM | POA: Diagnosis not present

## 2020-01-20 DIAGNOSIS — S0083XA Contusion of other part of head, initial encounter: Secondary | ICD-10-CM | POA: Diagnosis not present

## 2020-01-20 DIAGNOSIS — Z79899 Other long term (current) drug therapy: Secondary | ICD-10-CM | POA: Diagnosis not present

## 2020-01-20 DIAGNOSIS — M25572 Pain in left ankle and joints of left foot: Secondary | ICD-10-CM | POA: Diagnosis not present

## 2020-01-20 DIAGNOSIS — S52502A Unspecified fracture of the lower end of left radius, initial encounter for closed fracture: Secondary | ICD-10-CM | POA: Diagnosis not present

## 2020-01-20 DIAGNOSIS — I959 Hypotension, unspecified: Secondary | ICD-10-CM | POA: Diagnosis not present

## 2020-01-20 DIAGNOSIS — M25551 Pain in right hip: Secondary | ICD-10-CM | POA: Diagnosis not present

## 2020-01-20 DIAGNOSIS — G3184 Mild cognitive impairment, so stated: Secondary | ICD-10-CM | POA: Diagnosis not present

## 2020-01-20 DIAGNOSIS — F0390 Unspecified dementia without behavioral disturbance: Secondary | ICD-10-CM | POA: Diagnosis not present

## 2020-01-20 DIAGNOSIS — W19XXXA Unspecified fall, initial encounter: Secondary | ICD-10-CM | POA: Diagnosis not present

## 2020-01-20 DIAGNOSIS — S199XXA Unspecified injury of neck, initial encounter: Secondary | ICD-10-CM | POA: Diagnosis not present

## 2020-01-20 DIAGNOSIS — Z5181 Encounter for therapeutic drug level monitoring: Secondary | ICD-10-CM | POA: Diagnosis not present

## 2020-01-20 DIAGNOSIS — S0990XA Unspecified injury of head, initial encounter: Secondary | ICD-10-CM | POA: Diagnosis not present

## 2020-01-20 DIAGNOSIS — S51811A Laceration without foreign body of right forearm, initial encounter: Secondary | ICD-10-CM | POA: Diagnosis not present

## 2020-01-20 DIAGNOSIS — S59911A Unspecified injury of right forearm, initial encounter: Secondary | ICD-10-CM | POA: Diagnosis not present

## 2020-01-20 DIAGNOSIS — S79911A Unspecified injury of right hip, initial encounter: Secondary | ICD-10-CM | POA: Diagnosis not present

## 2020-01-20 DIAGNOSIS — T1490XA Injury, unspecified, initial encounter: Secondary | ICD-10-CM | POA: Diagnosis not present

## 2020-01-21 DIAGNOSIS — R079 Chest pain, unspecified: Secondary | ICD-10-CM | POA: Diagnosis not present

## 2020-01-21 DIAGNOSIS — F0390 Unspecified dementia without behavioral disturbance: Secondary | ICD-10-CM | POA: Diagnosis not present

## 2020-01-21 DIAGNOSIS — S299XXA Unspecified injury of thorax, initial encounter: Secondary | ICD-10-CM | POA: Diagnosis not present

## 2020-01-21 DIAGNOSIS — R402411 Glasgow coma scale score 13-15, in the field [EMT or ambulance]: Secondary | ICD-10-CM | POA: Diagnosis not present

## 2020-01-21 DIAGNOSIS — S0083XA Contusion of other part of head, initial encounter: Secondary | ICD-10-CM | POA: Diagnosis not present

## 2020-01-21 DIAGNOSIS — R269 Unspecified abnormalities of gait and mobility: Secondary | ICD-10-CM | POA: Diagnosis not present

## 2020-01-21 DIAGNOSIS — R404 Transient alteration of awareness: Secondary | ICD-10-CM | POA: Diagnosis not present

## 2020-01-21 DIAGNOSIS — T1490XA Injury, unspecified, initial encounter: Secondary | ICD-10-CM | POA: Diagnosis not present

## 2020-01-21 DIAGNOSIS — R0789 Other chest pain: Secondary | ICD-10-CM | POA: Diagnosis not present

## 2020-01-21 DIAGNOSIS — S199XXA Unspecified injury of neck, initial encounter: Secondary | ICD-10-CM | POA: Diagnosis not present

## 2020-01-21 DIAGNOSIS — S0990XA Unspecified injury of head, initial encounter: Secondary | ICD-10-CM | POA: Diagnosis not present

## 2020-01-21 DIAGNOSIS — Z743 Need for continuous supervision: Secondary | ICD-10-CM | POA: Diagnosis not present

## 2020-01-21 DIAGNOSIS — R410 Disorientation, unspecified: Secondary | ICD-10-CM | POA: Diagnosis not present

## 2020-01-22 DIAGNOSIS — R296 Repeated falls: Secondary | ICD-10-CM | POA: Diagnosis not present

## 2020-01-22 DIAGNOSIS — R627 Adult failure to thrive: Secondary | ICD-10-CM | POA: Diagnosis not present

## 2020-03-18 DIAGNOSIS — Z03818 Encounter for observation for suspected exposure to other biological agents ruled out: Secondary | ICD-10-CM | POA: Diagnosis not present

## 2020-03-27 DIAGNOSIS — F0390 Unspecified dementia without behavioral disturbance: Secondary | ICD-10-CM | POA: Diagnosis not present

## 2020-03-27 DIAGNOSIS — R451 Restlessness and agitation: Secondary | ICD-10-CM | POA: Diagnosis not present

## 2020-04-17 DIAGNOSIS — F0390 Unspecified dementia without behavioral disturbance: Secondary | ICD-10-CM | POA: Diagnosis not present

## 2020-04-17 DIAGNOSIS — K219 Gastro-esophageal reflux disease without esophagitis: Secondary | ICD-10-CM | POA: Diagnosis not present

## 2020-04-17 DIAGNOSIS — M158 Other polyosteoarthritis: Secondary | ICD-10-CM | POA: Diagnosis not present

## 2020-04-17 DIAGNOSIS — F0391 Unspecified dementia with behavioral disturbance: Secondary | ICD-10-CM | POA: Diagnosis not present

## 2020-04-17 DIAGNOSIS — Z515 Encounter for palliative care: Secondary | ICD-10-CM | POA: Diagnosis not present

## 2020-05-11 DIAGNOSIS — F0281 Dementia in other diseases classified elsewhere with behavioral disturbance: Secondary | ICD-10-CM | POA: Diagnosis not present

## 2020-05-11 DIAGNOSIS — F419 Anxiety disorder, unspecified: Secondary | ICD-10-CM | POA: Diagnosis not present

## 2020-05-11 DIAGNOSIS — K219 Gastro-esophageal reflux disease without esophagitis: Secondary | ICD-10-CM | POA: Diagnosis not present

## 2020-05-11 DIAGNOSIS — F324 Major depressive disorder, single episode, in partial remission: Secondary | ICD-10-CM | POA: Diagnosis not present

## 2020-05-11 DIAGNOSIS — M159 Polyosteoarthritis, unspecified: Secondary | ICD-10-CM | POA: Diagnosis not present

## 2020-05-13 DIAGNOSIS — I10 Essential (primary) hypertension: Secondary | ICD-10-CM | POA: Diagnosis not present

## 2020-05-14 DIAGNOSIS — E870 Hyperosmolality and hypernatremia: Secondary | ICD-10-CM | POA: Diagnosis not present

## 2020-05-20 DIAGNOSIS — I10 Essential (primary) hypertension: Secondary | ICD-10-CM | POA: Diagnosis not present

## 2020-05-22 DIAGNOSIS — Z515 Encounter for palliative care: Secondary | ICD-10-CM | POA: Diagnosis not present

## 2020-05-22 DIAGNOSIS — L602 Onychogryphosis: Secondary | ICD-10-CM | POA: Diagnosis not present

## 2020-05-22 DIAGNOSIS — B351 Tinea unguium: Secondary | ICD-10-CM | POA: Diagnosis not present

## 2020-05-22 DIAGNOSIS — E870 Hyperosmolality and hypernatremia: Secondary | ICD-10-CM | POA: Diagnosis not present

## 2020-05-22 DIAGNOSIS — I7389 Other specified peripheral vascular diseases: Secondary | ICD-10-CM | POA: Diagnosis not present

## 2020-06-05 DIAGNOSIS — I10 Essential (primary) hypertension: Secondary | ICD-10-CM | POA: Diagnosis not present

## 2020-06-09 DIAGNOSIS — R4189 Other symptoms and signs involving cognitive functions and awareness: Secondary | ICD-10-CM | POA: Diagnosis not present

## 2020-06-09 DIAGNOSIS — F0281 Dementia in other diseases classified elsewhere with behavioral disturbance: Secondary | ICD-10-CM | POA: Diagnosis not present

## 2020-06-21 DEATH — deceased
